# Patient Record
Sex: Female | Born: 1945 | Race: White | Hispanic: No | Marital: Married | Smoking: Current every day smoker
Health system: Southern US, Community
[De-identification: ages and names within clinical notes are randomized; demographics above are authoritative.]

## PROBLEM LIST (undated history)

## (undated) DIAGNOSIS — E079 Disorder of thyroid, unspecified: Secondary | ICD-10-CM

## (undated) DIAGNOSIS — E538 Deficiency of other specified B group vitamins: Secondary | ICD-10-CM

## (undated) DIAGNOSIS — F32A Depression, unspecified: Secondary | ICD-10-CM

## (undated) DIAGNOSIS — D649 Anemia, unspecified: Secondary | ICD-10-CM

## (undated) DIAGNOSIS — G459 Transient cerebral ischemic attack, unspecified: Secondary | ICD-10-CM

## (undated) DIAGNOSIS — J811 Chronic pulmonary edema: Secondary | ICD-10-CM

## (undated) DIAGNOSIS — J449 Chronic obstructive pulmonary disease, unspecified: Secondary | ICD-10-CM

## (undated) DIAGNOSIS — I10 Essential (primary) hypertension: Secondary | ICD-10-CM

## (undated) DIAGNOSIS — E871 Hypo-osmolality and hyponatremia: Secondary | ICD-10-CM

## (undated) DIAGNOSIS — I739 Peripheral vascular disease, unspecified: Secondary | ICD-10-CM

## (undated) HISTORY — DX: Hypo-osmolality and hyponatremia: E87.1

## (undated) HISTORY — DX: Depression, unspecified: F32.A

## (undated) HISTORY — DX: Disorder of thyroid, unspecified: E07.9

## (undated) HISTORY — DX: Essential (primary) hypertension: I10

## (undated) HISTORY — DX: Deficiency of other specified B group vitamins: E53.8

## (undated) HISTORY — DX: Transient cerebral ischemic attack, unspecified: G45.9

## (undated) HISTORY — DX: Chronic obstructive pulmonary disease, unspecified: J44.9

## (undated) HISTORY — PX: SHOULDER SURGERY: SHX246

## (undated) HISTORY — DX: Anemia, unspecified: D64.9

## (undated) HISTORY — PX: OTHER SURGICAL HISTORY: SHX169

## (undated) HISTORY — DX: Chronic pulmonary edema: J81.1

## (undated) HISTORY — DX: Peripheral vascular disease, unspecified: I73.9

---

## 2008-08-07 ENCOUNTER — Ambulatory Visit (HOSPITAL_COMMUNITY): Admission: RE | Admit: 2008-08-07 | Discharge: 2008-08-08 | Payer: Self-pay | Admitting: Neurosurgery

## 2010-01-20 IMAGING — CR DG CERVICAL SPINE 2 OR 3 VIEWS
1 series · 1 of 1 positions shown · non-contrast
Comparison: None

CLINICAL DATA: C5-6 fusion

CERVICAL SPINE - 2-3 VIEW

[view not recorded]
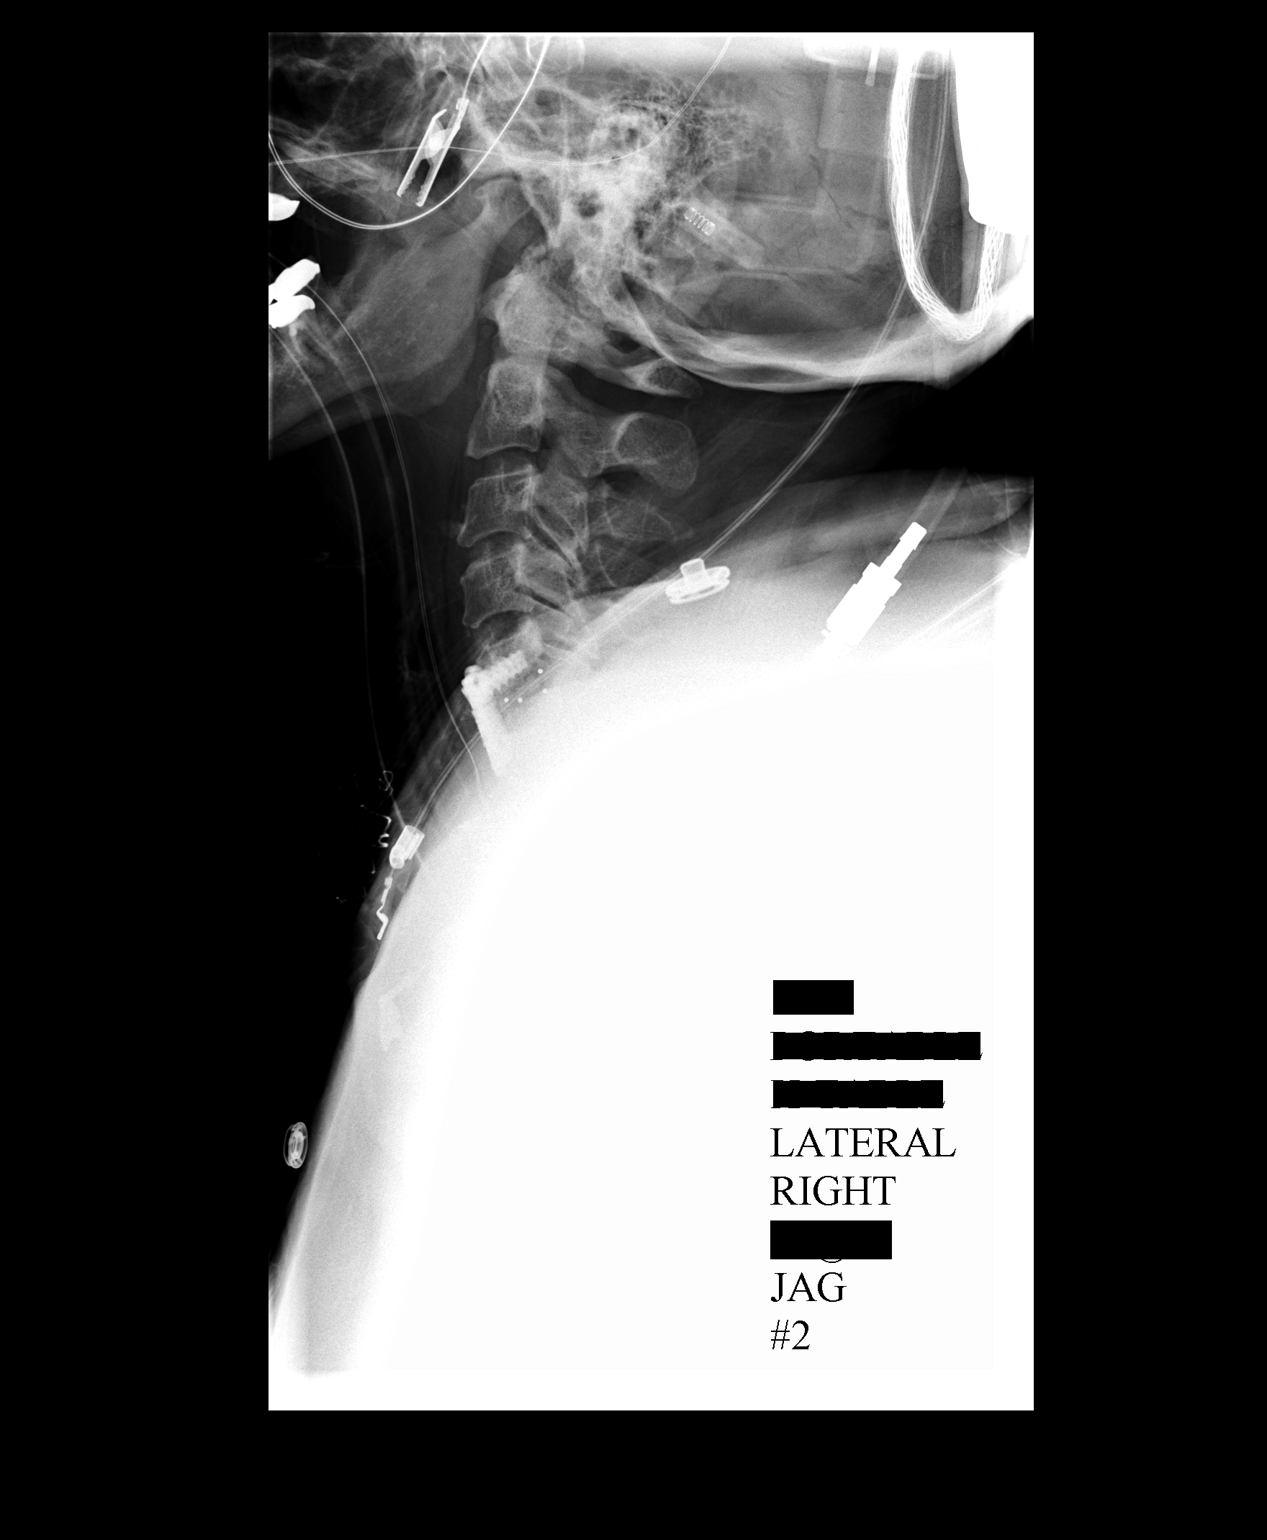

[1 of 1 positions shown; findings below may reference images not displayed]

FINDINGS: A surgical instrument projects over the C4-5 disc space
on the first image.

On the second image, plate and screws are seen transfixing C5-C6
with an interposed disc spacer.  C6 is obscured.  There is anatomic
alignment of the visualized vertebral bodies.  There is no breakage
or loosening of the hardware.
IMPRESSION: C5-6 anterior discectomy and fusion.

## 2011-03-25 NOTE — Op Note (Signed)
Ariel Baker, Ariel Baker            ACCOUNT NO.:  [id number]   MEDICAL RECORD NO.:  [id number]          PATIENT TYPE:  OIB   LOCATION:  3524                         FACILITY:  [facility]   PHYSICIAN:  Cristi Loron, M.D.DATE OF BIRTH:  1946/04/09   DATE OF PROCEDURE:  08/07/2008  DATE OF DISCHARGE:                               OPERATIVE REPORT   BRIEF HISTORY:  The patient is a 65 year old white female who has  suffered from neck and right arm pain consistent with right C6  radiculopathy.  She failed medical management and was worked up with a  cervical MRI which demonstrated the patient had spondylosis and stenosis  at C5-6.  I discussed the various treatment options with the patient  including surgery.  She has weighed the risks, benefits, and alternative  of the surgery, and decided to proceed with C5-6 anterior cervical  diskectomy, fusion, and plating.   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   PROCEDURES:  C5-6 extensive anterior cervical diskectomy/decompression;  C5-6 anterior interbody arthrodesis with local morselized autograft  bone, and active fused bone graft extender; insertion of C5-6 interbody  prosthesis (Alphatec PEEK interbody prosthesis); C5-6 anterior cervical  plating (Codman slim lock titanium plate and screws).   SURGEON:  Cristi Loron, MD   ASSISTANT:  Hilda Lias, MD   ANESTHESIA:  General endotracheal.   ESTIMATED BLOOD LOSS:  75 mL.   SPECIMENS:  None.   DRAINS:  None.   COMPLICATIONS:  None.   DESCRIPTION OF PROCEDURE:  The patient was brought to the operating room  by the anesthesia team.  General endotracheal anesthesia was induced.  The patient remained in supine position.  A roll was placed under her  shoulders to place  her neck in slight extension.  Her anterior cervical  region was then prepared with Betadine scrub and Betadine solution.  Sterile drapes were applied.  I then injected the area to be incised  with Marcaine with epinephrine solution.  I used a scalpel to make a  transverse incision in the patient's left anterior neck.  I used  Metzenbaum scissors to divide the platysma muscle and then to dissect  medial sternocleidomastoid muscle, jugular vein, and carotid artery.  We  carefully identified the esophagus retracting it medially.  We then used  a Kittner swabs to clear soft tissue from the anterior cervical spine  exposing intervertebral disk spaces.  I inserted a bent spinal needle  into the upper exposed intervertebral disk space and we obtained  intraoperative radiograph to confirm our location.   I then used electrocautery to detach the medial border of the longus  colli muscle bilaterally from C5-6 intervertebral disk space.  We then  inserted a Caspar self-retaining retractor underneath the longus colli  muscle bilaterally to provide exposure.   We began the decompression by incising the C5-6 intervertebral disk with  a #15 blade scalpel.  The disk space was quite narrow and spondylotic.  We performed a partial intervertebral diskectomy with the pituitary  forceps.  We then inserted the distraction screws at C5 and C6 and  distracted the interspace.  Then used high-speed drill to decorticate  the vertebral endplates at C5-6, drilled away the remainder of C5-6  intervertebral disk, to drill away some posterior spondylosis and to  thin out the posterior longitudinal ligament.  I then incised the  ligament with arachnoid knife and removed it with Kerrison punch  undercutting the vertebral endplates decompressing the thecal sac.  We  then performed foraminotomies about the bilateral C6 nerve root  completing the decompression at this level.   We now turned attention to the arthrodesis.   We used trial spacers and  determined to use 5-mm medium PEEK interbody prosthesis.  We prefilled  this prosthesis with a combination of local autograft bone we obtained  during the decompression and active fused bone graft extender.  We  inserted the prosthesis and distracted C5-6 interspace and then removed  distraction screws.  There was good snug fit of the prosthesis.   We now turned our attention to anterior spinal instrumentation.  We used  a high-speed drill to remove some ventral spondylosis from the vertebral  endplates at C5-6, so that the plate would lay down flat.  We selected  appropriate length Codman slim lock anterior cervical plate and laid it  along the anterior aspect of the vertebral bodies at C5-C6.  We then  drilled two 12-mm hole to C5-C6 and then secured the plate to the  vertebral bodies by placing two 12-mm self-tapping screws at C5-C6.  We  obtained intraoperative radiograph that demonstrated good position of  plate screws and interbody prosthesis with limited visualization because  of the patient's shoulders, but the construct looked good in vivo.  We  therefore secured the screws and plate by locking each cam.   We then obtained hemostasis using bipolar electrocautery.  We irrigated  the wound out with bacitracin solution.  We removed the retractor.  We  inspected the esophagus for any damage.  It was none apparent.  We then  reapproximated the patient's platysma muscle with interrupted 3-0 Vicryl  suture, subcutaneous tissue with interrupted 3-0 Vicryl suture, and the  skin with Steri-Strips and Benzoin.  The wound was then coated with  bacitracin ointment.  Sterile dressing was applied.  The drapes were  removed.  The patient was subsequently extubated by the anesthesia team  and transported to postanesthesia care unit in stable condition.  All  sponge, instrument, and needle counts were correct at the end of this  case.      Cristi Loron,  M.D.  Electronically Signed     JDJ/MEDQ  D:  08/07/2008  T:  08/08/2008  Job:  [id number]

## 2011-08-11 LAB — DIFFERENTIAL
Lymphocytes Relative: 29
Lymphs Abs: 2.5
Neutrophils Relative %: 58

## 2011-08-11 LAB — CBC
HCT: 38.6
Platelets: 291
WBC: 8.6

## 2011-08-11 LAB — BASIC METABOLIC PANEL
BUN: 4 — ABNORMAL LOW
GFR calc Af Amer: >60
GFR calc non Af Amer: >60
Potassium: 4.2

## 2015-03-09 DIAGNOSIS — Z0000 Encounter for general adult medical examination without abnormal findings: Secondary | ICD-10-CM | POA: Diagnosis not present

## 2015-03-09 DIAGNOSIS — H00013 Hordeolum externum right eye, unspecified eyelid: Secondary | ICD-10-CM | POA: Diagnosis not present

## 2015-03-09 DIAGNOSIS — Z720 Tobacco use: Secondary | ICD-10-CM | POA: Diagnosis not present

## 2015-03-23 DIAGNOSIS — B9689 Other specified bacterial agents as the cause of diseases classified elsewhere: Secondary | ICD-10-CM | POA: Diagnosis not present

## 2015-03-23 DIAGNOSIS — J019 Acute sinusitis, unspecified: Secondary | ICD-10-CM | POA: Diagnosis not present

## 2015-04-27 DIAGNOSIS — I10 Essential (primary) hypertension: Secondary | ICD-10-CM | POA: Diagnosis not present

## 2015-04-27 DIAGNOSIS — M19019 Primary osteoarthritis, unspecified shoulder: Secondary | ICD-10-CM | POA: Diagnosis not present

## 2015-04-27 DIAGNOSIS — K219 Gastro-esophageal reflux disease without esophagitis: Secondary | ICD-10-CM | POA: Diagnosis not present

## 2015-04-27 DIAGNOSIS — Z79899 Other long term (current) drug therapy: Secondary | ICD-10-CM | POA: Diagnosis not present

## 2015-04-27 DIAGNOSIS — E782 Mixed hyperlipidemia: Secondary | ICD-10-CM | POA: Diagnosis not present

## 2015-04-27 DIAGNOSIS — J449 Chronic obstructive pulmonary disease, unspecified: Secondary | ICD-10-CM | POA: Diagnosis not present

## 2015-10-03 DIAGNOSIS — J441 Chronic obstructive pulmonary disease with (acute) exacerbation: Secondary | ICD-10-CM | POA: Diagnosis not present

## 2015-10-03 DIAGNOSIS — H6120 Impacted cerumen, unspecified ear: Secondary | ICD-10-CM | POA: Diagnosis not present

## 2015-12-26 DIAGNOSIS — Z9181 History of falling: Secondary | ICD-10-CM | POA: Diagnosis not present

## 2015-12-26 DIAGNOSIS — H109 Unspecified conjunctivitis: Secondary | ICD-10-CM | POA: Diagnosis not present

## 2015-12-26 DIAGNOSIS — J441 Chronic obstructive pulmonary disease with (acute) exacerbation: Secondary | ICD-10-CM | POA: Diagnosis not present

## 2015-12-26 DIAGNOSIS — Z1389 Encounter for screening for other disorder: Secondary | ICD-10-CM | POA: Diagnosis not present

## 2016-01-10 DIAGNOSIS — J449 Chronic obstructive pulmonary disease, unspecified: Secondary | ICD-10-CM | POA: Diagnosis not present

## 2016-01-11 DIAGNOSIS — J449 Chronic obstructive pulmonary disease, unspecified: Secondary | ICD-10-CM | POA: Diagnosis not present

## 2016-05-29 DIAGNOSIS — E782 Mixed hyperlipidemia: Secondary | ICD-10-CM | POA: Diagnosis not present

## 2016-05-29 DIAGNOSIS — J449 Chronic obstructive pulmonary disease, unspecified: Secondary | ICD-10-CM | POA: Diagnosis not present

## 2016-05-29 DIAGNOSIS — I10 Essential (primary) hypertension: Secondary | ICD-10-CM | POA: Diagnosis not present

## 2016-05-29 DIAGNOSIS — Z0000 Encounter for general adult medical examination without abnormal findings: Secondary | ICD-10-CM | POA: Diagnosis not present

## 2016-05-29 DIAGNOSIS — Z23 Encounter for immunization: Secondary | ICD-10-CM | POA: Diagnosis not present

## 2016-05-29 DIAGNOSIS — K219 Gastro-esophageal reflux disease without esophagitis: Secondary | ICD-10-CM | POA: Diagnosis not present

## 2016-05-29 DIAGNOSIS — Z79899 Other long term (current) drug therapy: Secondary | ICD-10-CM | POA: Diagnosis not present

## 2017-06-01 DIAGNOSIS — Z79899 Other long term (current) drug therapy: Secondary | ICD-10-CM | POA: Diagnosis not present

## 2017-06-01 DIAGNOSIS — Z6823 Body mass index (BMI) 23.0-23.9, adult: Secondary | ICD-10-CM | POA: Diagnosis not present

## 2017-06-01 DIAGNOSIS — I10 Essential (primary) hypertension: Secondary | ICD-10-CM | POA: Diagnosis not present

## 2017-06-01 DIAGNOSIS — K219 Gastro-esophageal reflux disease without esophagitis: Secondary | ICD-10-CM | POA: Diagnosis not present

## 2017-06-01 DIAGNOSIS — H6120 Impacted cerumen, unspecified ear: Secondary | ICD-10-CM | POA: Diagnosis not present

## 2017-06-01 DIAGNOSIS — Z0000 Encounter for general adult medical examination without abnormal findings: Secondary | ICD-10-CM | POA: Diagnosis not present

## 2017-07-08 DIAGNOSIS — Z1211 Encounter for screening for malignant neoplasm of colon: Secondary | ICD-10-CM | POA: Diagnosis not present

## 2017-08-07 DIAGNOSIS — S40012A Contusion of left shoulder, initial encounter: Secondary | ICD-10-CM | POA: Diagnosis not present

## 2018-08-11 DIAGNOSIS — E782 Mixed hyperlipidemia: Secondary | ICD-10-CM | POA: Diagnosis not present

## 2018-08-11 DIAGNOSIS — Z23 Encounter for immunization: Secondary | ICD-10-CM | POA: Diagnosis not present

## 2018-08-11 DIAGNOSIS — Z6822 Body mass index (BMI) 22.0-22.9, adult: Secondary | ICD-10-CM | POA: Diagnosis not present

## 2018-08-11 DIAGNOSIS — Z0000 Encounter for general adult medical examination without abnormal findings: Secondary | ICD-10-CM | POA: Diagnosis not present

## 2018-08-11 DIAGNOSIS — Z79899 Other long term (current) drug therapy: Secondary | ICD-10-CM | POA: Diagnosis not present

## 2019-07-11 DIAGNOSIS — E782 Mixed hyperlipidemia: Secondary | ICD-10-CM | POA: Diagnosis not present

## 2019-07-11 DIAGNOSIS — I10 Essential (primary) hypertension: Secondary | ICD-10-CM | POA: Diagnosis not present

## 2019-08-23 DIAGNOSIS — Z0000 Encounter for general adult medical examination without abnormal findings: Secondary | ICD-10-CM | POA: Diagnosis not present

## 2019-08-23 DIAGNOSIS — E782 Mixed hyperlipidemia: Secondary | ICD-10-CM | POA: Diagnosis not present

## 2019-08-23 DIAGNOSIS — Z23 Encounter for immunization: Secondary | ICD-10-CM | POA: Diagnosis not present

## 2019-08-23 DIAGNOSIS — Z6823 Body mass index (BMI) 23.0-23.9, adult: Secondary | ICD-10-CM | POA: Diagnosis not present

## 2019-08-23 DIAGNOSIS — Z79899 Other long term (current) drug therapy: Secondary | ICD-10-CM | POA: Diagnosis not present

## 2020-10-15 DIAGNOSIS — Z23 Encounter for immunization: Secondary | ICD-10-CM | POA: Diagnosis not present

## 2020-10-15 DIAGNOSIS — E782 Mixed hyperlipidemia: Secondary | ICD-10-CM | POA: Diagnosis not present

## 2020-10-15 DIAGNOSIS — Z0000 Encounter for general adult medical examination without abnormal findings: Secondary | ICD-10-CM | POA: Diagnosis not present

## 2020-10-15 DIAGNOSIS — I10 Essential (primary) hypertension: Secondary | ICD-10-CM | POA: Diagnosis not present

## 2020-10-15 DIAGNOSIS — Z6822 Body mass index (BMI) 22.0-22.9, adult: Secondary | ICD-10-CM | POA: Diagnosis not present

## 2020-10-15 DIAGNOSIS — Z79899 Other long term (current) drug therapy: Secondary | ICD-10-CM | POA: Diagnosis not present

## 2021-10-23 DIAGNOSIS — K219 Gastro-esophageal reflux disease without esophagitis: Secondary | ICD-10-CM | POA: Diagnosis not present

## 2021-10-23 DIAGNOSIS — I10 Essential (primary) hypertension: Secondary | ICD-10-CM | POA: Diagnosis not present

## 2021-10-23 DIAGNOSIS — Z23 Encounter for immunization: Secondary | ICD-10-CM | POA: Diagnosis not present

## 2021-10-23 DIAGNOSIS — J449 Chronic obstructive pulmonary disease, unspecified: Secondary | ICD-10-CM | POA: Diagnosis not present

## 2021-10-23 DIAGNOSIS — Z0000 Encounter for general adult medical examination without abnormal findings: Secondary | ICD-10-CM | POA: Diagnosis not present

## 2021-10-23 DIAGNOSIS — R2689 Other abnormalities of gait and mobility: Secondary | ICD-10-CM | POA: Diagnosis not present

## 2021-10-23 DIAGNOSIS — E782 Mixed hyperlipidemia: Secondary | ICD-10-CM | POA: Diagnosis not present

## 2021-10-23 DIAGNOSIS — Z6821 Body mass index (BMI) 21.0-21.9, adult: Secondary | ICD-10-CM | POA: Diagnosis not present

## 2022-10-27 DIAGNOSIS — J441 Chronic obstructive pulmonary disease with (acute) exacerbation: Secondary | ICD-10-CM | POA: Diagnosis not present

## 2022-10-27 DIAGNOSIS — Z6820 Body mass index (BMI) 20.0-20.9, adult: Secondary | ICD-10-CM | POA: Diagnosis not present

## 2022-10-27 DIAGNOSIS — E782 Mixed hyperlipidemia: Secondary | ICD-10-CM | POA: Diagnosis not present

## 2022-10-27 DIAGNOSIS — Z79899 Other long term (current) drug therapy: Secondary | ICD-10-CM | POA: Diagnosis not present

## 2022-10-27 DIAGNOSIS — Z23 Encounter for immunization: Secondary | ICD-10-CM | POA: Diagnosis not present

## 2022-10-27 DIAGNOSIS — Z0000 Encounter for general adult medical examination without abnormal findings: Secondary | ICD-10-CM | POA: Diagnosis not present

## 2022-10-27 DIAGNOSIS — Z760 Encounter for issue of repeat prescription: Secondary | ICD-10-CM | POA: Diagnosis not present

## 2023-02-10 DIAGNOSIS — Z7409 Other reduced mobility: Secondary | ICD-10-CM | POA: Diagnosis not present

## 2023-02-10 DIAGNOSIS — Z79899 Other long term (current) drug therapy: Secondary | ICD-10-CM | POA: Diagnosis not present

## 2023-02-10 DIAGNOSIS — Z7401 Bed confinement status: Secondary | ICD-10-CM | POA: Diagnosis not present

## 2023-02-10 DIAGNOSIS — Z743 Need for continuous supervision: Secondary | ICD-10-CM | POA: Diagnosis not present

## 2023-02-10 DIAGNOSIS — M978XXA Periprosthetic fracture around other internal prosthetic joint, initial encounter: Secondary | ICD-10-CM | POA: Diagnosis not present

## 2023-02-10 DIAGNOSIS — W19XXXD Unspecified fall, subsequent encounter: Secondary | ICD-10-CM | POA: Diagnosis not present

## 2023-02-10 DIAGNOSIS — Z96642 Presence of left artificial hip joint: Secondary | ICD-10-CM | POA: Diagnosis not present

## 2023-02-10 DIAGNOSIS — R410 Disorientation, unspecified: Secondary | ICD-10-CM | POA: Diagnosis not present

## 2023-02-10 DIAGNOSIS — Z471 Aftercare following joint replacement surgery: Secondary | ICD-10-CM | POA: Diagnosis not present

## 2023-02-10 DIAGNOSIS — M9702XA Periprosthetic fracture around internal prosthetic left hip joint, initial encounter: Secondary | ICD-10-CM | POA: Diagnosis not present

## 2023-02-10 DIAGNOSIS — R262 Difficulty in walking, not elsewhere classified: Secondary | ICD-10-CM | POA: Diagnosis not present

## 2023-02-10 DIAGNOSIS — Z7982 Long term (current) use of aspirin: Secondary | ICD-10-CM | POA: Diagnosis not present

## 2023-02-10 DIAGNOSIS — Z96643 Presence of artificial hip joint, bilateral: Secondary | ICD-10-CM | POA: Diagnosis not present

## 2023-02-10 DIAGNOSIS — D649 Anemia, unspecified: Secondary | ICD-10-CM | POA: Diagnosis not present

## 2023-02-10 DIAGNOSIS — A419 Sepsis, unspecified organism: Secondary | ICD-10-CM | POA: Diagnosis not present

## 2023-02-10 DIAGNOSIS — W19XXXA Unspecified fall, initial encounter: Secondary | ICD-10-CM | POA: Diagnosis not present

## 2023-02-10 DIAGNOSIS — R0902 Hypoxemia: Secondary | ICD-10-CM | POA: Diagnosis not present

## 2023-02-10 DIAGNOSIS — K219 Gastro-esophageal reflux disease without esophagitis: Secondary | ICD-10-CM | POA: Diagnosis not present

## 2023-02-10 DIAGNOSIS — M1990 Unspecified osteoarthritis, unspecified site: Secondary | ICD-10-CM | POA: Diagnosis not present

## 2023-02-10 DIAGNOSIS — Z741 Need for assistance with personal care: Secondary | ICD-10-CM | POA: Diagnosis not present

## 2023-02-10 DIAGNOSIS — E871 Hypo-osmolality and hyponatremia: Secondary | ICD-10-CM | POA: Diagnosis not present

## 2023-02-10 DIAGNOSIS — S72112A Displaced fracture of greater trochanter of left femur, initial encounter for closed fracture: Secondary | ICD-10-CM | POA: Diagnosis not present

## 2023-02-10 DIAGNOSIS — F32A Depression, unspecified: Secondary | ICD-10-CM | POA: Diagnosis not present

## 2023-02-10 DIAGNOSIS — I10 Essential (primary) hypertension: Secondary | ICD-10-CM | POA: Diagnosis not present

## 2023-02-10 DIAGNOSIS — Z885 Allergy status to narcotic agent status: Secondary | ICD-10-CM | POA: Diagnosis not present

## 2023-02-10 DIAGNOSIS — J449 Chronic obstructive pulmonary disease, unspecified: Secondary | ICD-10-CM | POA: Diagnosis not present

## 2023-02-10 DIAGNOSIS — S0990XA Unspecified injury of head, initial encounter: Secondary | ICD-10-CM | POA: Diagnosis not present

## 2023-02-10 DIAGNOSIS — R279 Unspecified lack of coordination: Secondary | ICD-10-CM | POA: Diagnosis not present

## 2023-02-10 DIAGNOSIS — R5381 Other malaise: Secondary | ICD-10-CM | POA: Diagnosis not present

## 2023-02-10 DIAGNOSIS — M6281 Muscle weakness (generalized): Secondary | ICD-10-CM | POA: Diagnosis not present

## 2023-02-10 DIAGNOSIS — M9702XD Periprosthetic fracture around internal prosthetic left hip joint, subsequent encounter: Secondary | ICD-10-CM | POA: Diagnosis not present

## 2023-02-10 DIAGNOSIS — R488 Other symbolic dysfunctions: Secondary | ICD-10-CM | POA: Diagnosis not present

## 2023-02-10 DIAGNOSIS — F17210 Nicotine dependence, cigarettes, uncomplicated: Secondary | ICD-10-CM | POA: Diagnosis not present

## 2023-02-10 DIAGNOSIS — Z8673 Personal history of transient ischemic attack (TIA), and cerebral infarction without residual deficits: Secondary | ICD-10-CM | POA: Diagnosis not present

## 2023-02-10 DIAGNOSIS — M25552 Pain in left hip: Secondary | ICD-10-CM | POA: Diagnosis not present

## 2023-02-10 DIAGNOSIS — R2681 Unsteadiness on feet: Secondary | ICD-10-CM | POA: Diagnosis not present

## 2023-02-13 DIAGNOSIS — I10 Essential (primary) hypertension: Secondary | ICD-10-CM | POA: Diagnosis not present

## 2023-02-13 DIAGNOSIS — M79631 Pain in right forearm: Secondary | ICD-10-CM | POA: Diagnosis not present

## 2023-02-13 DIAGNOSIS — J810 Acute pulmonary edema: Secondary | ICD-10-CM | POA: Diagnosis not present

## 2023-02-13 DIAGNOSIS — R279 Unspecified lack of coordination: Secondary | ICD-10-CM | POA: Diagnosis not present

## 2023-02-13 DIAGNOSIS — Z79899 Other long term (current) drug therapy: Secondary | ICD-10-CM | POA: Diagnosis not present

## 2023-02-13 DIAGNOSIS — G9341 Metabolic encephalopathy: Secondary | ICD-10-CM | POA: Diagnosis not present

## 2023-02-13 DIAGNOSIS — Z743 Need for continuous supervision: Secondary | ICD-10-CM | POA: Diagnosis not present

## 2023-02-13 DIAGNOSIS — I739 Peripheral vascular disease, unspecified: Secondary | ICD-10-CM | POA: Diagnosis not present

## 2023-02-13 DIAGNOSIS — M978XXA Periprosthetic fracture around other internal prosthetic joint, initial encounter: Secondary | ICD-10-CM | POA: Diagnosis not present

## 2023-02-13 DIAGNOSIS — Z7409 Other reduced mobility: Secondary | ICD-10-CM | POA: Diagnosis not present

## 2023-02-13 DIAGNOSIS — R5381 Other malaise: Secondary | ICD-10-CM | POA: Diagnosis not present

## 2023-02-13 DIAGNOSIS — Z8673 Personal history of transient ischemic attack (TIA), and cerebral infarction without residual deficits: Secondary | ICD-10-CM | POA: Diagnosis not present

## 2023-02-13 DIAGNOSIS — M9702XA Periprosthetic fracture around internal prosthetic left hip joint, initial encounter: Secondary | ICD-10-CM | POA: Diagnosis not present

## 2023-02-13 DIAGNOSIS — Z043 Encounter for examination and observation following other accident: Secondary | ICD-10-CM | POA: Diagnosis not present

## 2023-02-13 DIAGNOSIS — R488 Other symbolic dysfunctions: Secondary | ICD-10-CM | POA: Diagnosis not present

## 2023-02-13 DIAGNOSIS — R519 Headache, unspecified: Secondary | ICD-10-CM | POA: Diagnosis not present

## 2023-02-13 DIAGNOSIS — E861 Hypovolemia: Secondary | ICD-10-CM | POA: Diagnosis not present

## 2023-02-13 DIAGNOSIS — R2681 Unsteadiness on feet: Secondary | ICD-10-CM | POA: Diagnosis not present

## 2023-02-13 DIAGNOSIS — Z7401 Bed confinement status: Secondary | ICD-10-CM | POA: Diagnosis not present

## 2023-02-13 DIAGNOSIS — E119 Type 2 diabetes mellitus without complications: Secondary | ICD-10-CM | POA: Diagnosis not present

## 2023-02-13 DIAGNOSIS — R531 Weakness: Secondary | ICD-10-CM | POA: Diagnosis not present

## 2023-02-13 DIAGNOSIS — R0902 Hypoxemia: Secondary | ICD-10-CM | POA: Diagnosis not present

## 2023-02-13 DIAGNOSIS — W19XXXA Unspecified fall, initial encounter: Secondary | ICD-10-CM | POA: Diagnosis not present

## 2023-02-13 DIAGNOSIS — D519 Vitamin B12 deficiency anemia, unspecified: Secondary | ICD-10-CM | POA: Diagnosis not present

## 2023-02-13 DIAGNOSIS — Z7189 Other specified counseling: Secondary | ICD-10-CM | POA: Diagnosis not present

## 2023-02-13 DIAGNOSIS — Z471 Aftercare following joint replacement surgery: Secondary | ICD-10-CM | POA: Diagnosis not present

## 2023-02-13 DIAGNOSIS — Z7982 Long term (current) use of aspirin: Secondary | ICD-10-CM | POA: Diagnosis not present

## 2023-02-13 DIAGNOSIS — F32A Depression, unspecified: Secondary | ICD-10-CM | POA: Diagnosis not present

## 2023-02-13 DIAGNOSIS — W19XXXD Unspecified fall, subsequent encounter: Secondary | ICD-10-CM | POA: Diagnosis not present

## 2023-02-13 DIAGNOSIS — J449 Chronic obstructive pulmonary disease, unspecified: Secondary | ICD-10-CM | POA: Diagnosis not present

## 2023-02-13 DIAGNOSIS — R410 Disorientation, unspecified: Secondary | ICD-10-CM | POA: Diagnosis not present

## 2023-02-13 DIAGNOSIS — D509 Iron deficiency anemia, unspecified: Secondary | ICD-10-CM | POA: Diagnosis not present

## 2023-02-13 DIAGNOSIS — E871 Hypo-osmolality and hyponatremia: Secondary | ICD-10-CM | POA: Diagnosis not present

## 2023-02-13 DIAGNOSIS — M1990 Unspecified osteoarthritis, unspecified site: Secondary | ICD-10-CM | POA: Diagnosis not present

## 2023-02-13 DIAGNOSIS — K219 Gastro-esophageal reflux disease without esophagitis: Secondary | ICD-10-CM | POA: Diagnosis not present

## 2023-02-13 DIAGNOSIS — R069 Unspecified abnormalities of breathing: Secondary | ICD-10-CM | POA: Diagnosis not present

## 2023-02-13 DIAGNOSIS — Z741 Need for assistance with personal care: Secondary | ICD-10-CM | POA: Diagnosis not present

## 2023-02-13 DIAGNOSIS — A419 Sepsis, unspecified organism: Secondary | ICD-10-CM | POA: Diagnosis not present

## 2023-02-13 DIAGNOSIS — Z8711 Personal history of peptic ulcer disease: Secondary | ICD-10-CM | POA: Diagnosis not present

## 2023-02-13 DIAGNOSIS — M9702XD Periprosthetic fracture around internal prosthetic left hip joint, subsequent encounter: Secondary | ICD-10-CM | POA: Diagnosis not present

## 2023-02-13 DIAGNOSIS — Z96649 Presence of unspecified artificial hip joint: Secondary | ICD-10-CM | POA: Diagnosis not present

## 2023-02-13 DIAGNOSIS — S72112A Displaced fracture of greater trochanter of left femur, initial encounter for closed fracture: Secondary | ICD-10-CM | POA: Diagnosis not present

## 2023-02-13 DIAGNOSIS — Z885 Allergy status to narcotic agent status: Secondary | ICD-10-CM | POA: Diagnosis not present

## 2023-02-13 DIAGNOSIS — M6281 Muscle weakness (generalized): Secondary | ICD-10-CM | POA: Diagnosis not present

## 2023-02-13 DIAGNOSIS — D649 Anemia, unspecified: Secondary | ICD-10-CM | POA: Diagnosis not present

## 2023-02-16 DIAGNOSIS — J449 Chronic obstructive pulmonary disease, unspecified: Secondary | ICD-10-CM | POA: Diagnosis not present

## 2023-02-17 DIAGNOSIS — J449 Chronic obstructive pulmonary disease, unspecified: Secondary | ICD-10-CM | POA: Diagnosis not present

## 2023-02-19 DIAGNOSIS — E119 Type 2 diabetes mellitus without complications: Secondary | ICD-10-CM | POA: Diagnosis not present

## 2023-02-19 DIAGNOSIS — Z7189 Other specified counseling: Secondary | ICD-10-CM | POA: Diagnosis not present

## 2023-03-05 DIAGNOSIS — M978XXA Periprosthetic fracture around other internal prosthetic joint, initial encounter: Secondary | ICD-10-CM | POA: Diagnosis not present

## 2023-03-05 DIAGNOSIS — Z96649 Presence of unspecified artificial hip joint: Secondary | ICD-10-CM | POA: Diagnosis not present

## 2023-03-12 DIAGNOSIS — I10 Essential (primary) hypertension: Secondary | ICD-10-CM | POA: Diagnosis not present

## 2023-03-12 DIAGNOSIS — Z043 Encounter for examination and observation following other accident: Secondary | ICD-10-CM | POA: Diagnosis not present

## 2023-03-12 DIAGNOSIS — Z885 Allergy status to narcotic agent status: Secondary | ICD-10-CM | POA: Diagnosis not present

## 2023-03-12 DIAGNOSIS — R0902 Hypoxemia: Secondary | ICD-10-CM | POA: Diagnosis not present

## 2023-03-12 DIAGNOSIS — Z79899 Other long term (current) drug therapy: Secondary | ICD-10-CM | POA: Diagnosis not present

## 2023-03-12 DIAGNOSIS — Z741 Need for assistance with personal care: Secondary | ICD-10-CM | POA: Diagnosis not present

## 2023-03-12 DIAGNOSIS — J449 Chronic obstructive pulmonary disease, unspecified: Secondary | ICD-10-CM | POA: Diagnosis not present

## 2023-03-12 DIAGNOSIS — R488 Other symbolic dysfunctions: Secondary | ICD-10-CM | POA: Diagnosis not present

## 2023-03-12 DIAGNOSIS — Z7982 Long term (current) use of aspirin: Secondary | ICD-10-CM | POA: Diagnosis not present

## 2023-03-12 DIAGNOSIS — M79631 Pain in right forearm: Secondary | ICD-10-CM | POA: Diagnosis not present

## 2023-03-12 DIAGNOSIS — Z8673 Personal history of transient ischemic attack (TIA), and cerebral infarction without residual deficits: Secondary | ICD-10-CM | POA: Diagnosis not present

## 2023-03-12 DIAGNOSIS — W19XXXD Unspecified fall, subsequent encounter: Secondary | ICD-10-CM | POA: Diagnosis not present

## 2023-03-12 DIAGNOSIS — Z7401 Bed confinement status: Secondary | ICD-10-CM | POA: Diagnosis not present

## 2023-03-12 DIAGNOSIS — Z471 Aftercare following joint replacement surgery: Secondary | ICD-10-CM | POA: Diagnosis not present

## 2023-03-12 DIAGNOSIS — R069 Unspecified abnormalities of breathing: Secondary | ICD-10-CM | POA: Diagnosis not present

## 2023-03-12 DIAGNOSIS — R1319 Other dysphagia: Secondary | ICD-10-CM | POA: Diagnosis not present

## 2023-03-12 DIAGNOSIS — M6281 Muscle weakness (generalized): Secondary | ICD-10-CM | POA: Diagnosis not present

## 2023-03-12 DIAGNOSIS — Z7409 Other reduced mobility: Secondary | ICD-10-CM | POA: Diagnosis not present

## 2023-03-12 DIAGNOSIS — M1990 Unspecified osteoarthritis, unspecified site: Secondary | ICD-10-CM | POA: Diagnosis not present

## 2023-03-12 DIAGNOSIS — G9341 Metabolic encephalopathy: Secondary | ICD-10-CM | POA: Diagnosis not present

## 2023-03-12 DIAGNOSIS — D519 Vitamin B12 deficiency anemia, unspecified: Secondary | ICD-10-CM | POA: Diagnosis not present

## 2023-03-12 DIAGNOSIS — J810 Acute pulmonary edema: Secondary | ICD-10-CM | POA: Diagnosis not present

## 2023-03-12 DIAGNOSIS — R410 Disorientation, unspecified: Secondary | ICD-10-CM | POA: Diagnosis not present

## 2023-03-12 DIAGNOSIS — Z8711 Personal history of peptic ulcer disease: Secondary | ICD-10-CM | POA: Diagnosis not present

## 2023-03-12 DIAGNOSIS — F32A Depression, unspecified: Secondary | ICD-10-CM | POA: Diagnosis not present

## 2023-03-12 DIAGNOSIS — R0600 Dyspnea, unspecified: Secondary | ICD-10-CM | POA: Diagnosis not present

## 2023-03-12 DIAGNOSIS — D649 Anemia, unspecified: Secondary | ICD-10-CM | POA: Diagnosis not present

## 2023-03-12 DIAGNOSIS — E861 Hypovolemia: Secondary | ICD-10-CM | POA: Diagnosis not present

## 2023-03-12 DIAGNOSIS — E871 Hypo-osmolality and hyponatremia: Secondary | ICD-10-CM | POA: Diagnosis not present

## 2023-03-12 DIAGNOSIS — D509 Iron deficiency anemia, unspecified: Secondary | ICD-10-CM | POA: Diagnosis not present

## 2023-03-12 DIAGNOSIS — W19XXXA Unspecified fall, initial encounter: Secondary | ICD-10-CM | POA: Diagnosis not present

## 2023-03-12 DIAGNOSIS — J811 Chronic pulmonary edema: Secondary | ICD-10-CM | POA: Diagnosis not present

## 2023-03-12 DIAGNOSIS — Z743 Need for continuous supervision: Secondary | ICD-10-CM | POA: Diagnosis not present

## 2023-03-12 DIAGNOSIS — R2681 Unsteadiness on feet: Secondary | ICD-10-CM | POA: Diagnosis not present

## 2023-03-12 DIAGNOSIS — R519 Headache, unspecified: Secondary | ICD-10-CM | POA: Diagnosis not present

## 2023-03-12 DIAGNOSIS — I739 Peripheral vascular disease, unspecified: Secondary | ICD-10-CM | POA: Diagnosis not present

## 2023-03-12 DIAGNOSIS — K219 Gastro-esophageal reflux disease without esophagitis: Secondary | ICD-10-CM | POA: Diagnosis not present

## 2023-03-12 DIAGNOSIS — M9702XD Periprosthetic fracture around internal prosthetic left hip joint, subsequent encounter: Secondary | ICD-10-CM | POA: Diagnosis not present

## 2023-03-12 DIAGNOSIS — R531 Weakness: Secondary | ICD-10-CM | POA: Diagnosis not present

## 2023-03-17 DIAGNOSIS — J810 Acute pulmonary edema: Secondary | ICD-10-CM | POA: Diagnosis not present

## 2023-03-17 DIAGNOSIS — W19XXXD Unspecified fall, subsequent encounter: Secondary | ICD-10-CM | POA: Diagnosis not present

## 2023-03-17 DIAGNOSIS — Z743 Need for continuous supervision: Secondary | ICD-10-CM | POA: Diagnosis not present

## 2023-03-17 DIAGNOSIS — N390 Urinary tract infection, site not specified: Secondary | ICD-10-CM | POA: Diagnosis not present

## 2023-03-17 DIAGNOSIS — Z8673 Personal history of transient ischemic attack (TIA), and cerebral infarction without residual deficits: Secondary | ICD-10-CM | POA: Diagnosis not present

## 2023-03-17 DIAGNOSIS — Z741 Need for assistance with personal care: Secondary | ICD-10-CM | POA: Diagnosis not present

## 2023-03-17 DIAGNOSIS — Z471 Aftercare following joint replacement surgery: Secondary | ICD-10-CM | POA: Diagnosis not present

## 2023-03-17 DIAGNOSIS — B871 Wound myiasis: Secondary | ICD-10-CM | POA: Diagnosis not present

## 2023-03-17 DIAGNOSIS — F32A Depression, unspecified: Secondary | ICD-10-CM | POA: Diagnosis not present

## 2023-03-17 DIAGNOSIS — I10 Essential (primary) hypertension: Secondary | ICD-10-CM | POA: Diagnosis not present

## 2023-03-17 DIAGNOSIS — E871 Hypo-osmolality and hyponatremia: Secondary | ICD-10-CM | POA: Diagnosis not present

## 2023-03-17 DIAGNOSIS — I739 Peripheral vascular disease, unspecified: Secondary | ICD-10-CM | POA: Diagnosis not present

## 2023-03-17 DIAGNOSIS — Z79899 Other long term (current) drug therapy: Secondary | ICD-10-CM | POA: Diagnosis not present

## 2023-03-17 DIAGNOSIS — R531 Weakness: Secondary | ICD-10-CM | POA: Diagnosis not present

## 2023-03-17 DIAGNOSIS — D649 Anemia, unspecified: Secondary | ICD-10-CM | POA: Diagnosis not present

## 2023-03-17 DIAGNOSIS — D519 Vitamin B12 deficiency anemia, unspecified: Secondary | ICD-10-CM | POA: Diagnosis not present

## 2023-03-17 DIAGNOSIS — R488 Other symbolic dysfunctions: Secondary | ICD-10-CM | POA: Diagnosis not present

## 2023-03-17 DIAGNOSIS — J449 Chronic obstructive pulmonary disease, unspecified: Secondary | ICD-10-CM | POA: Diagnosis not present

## 2023-03-17 DIAGNOSIS — R2681 Unsteadiness on feet: Secondary | ICD-10-CM | POA: Diagnosis not present

## 2023-03-17 DIAGNOSIS — R1319 Other dysphagia: Secondary | ICD-10-CM | POA: Diagnosis not present

## 2023-03-17 DIAGNOSIS — R0600 Dyspnea, unspecified: Secondary | ICD-10-CM | POA: Diagnosis not present

## 2023-03-17 DIAGNOSIS — M6281 Muscle weakness (generalized): Secondary | ICD-10-CM | POA: Diagnosis not present

## 2023-03-17 DIAGNOSIS — M9702XD Periprosthetic fracture around internal prosthetic left hip joint, subsequent encounter: Secondary | ICD-10-CM | POA: Diagnosis not present

## 2023-03-17 DIAGNOSIS — G9341 Metabolic encephalopathy: Secondary | ICD-10-CM | POA: Diagnosis not present

## 2023-03-17 DIAGNOSIS — D509 Iron deficiency anemia, unspecified: Secondary | ICD-10-CM | POA: Diagnosis not present

## 2023-03-17 DIAGNOSIS — Z7401 Bed confinement status: Secondary | ICD-10-CM | POA: Diagnosis not present

## 2023-03-17 DIAGNOSIS — Z7409 Other reduced mobility: Secondary | ICD-10-CM | POA: Diagnosis not present

## 2023-03-17 DIAGNOSIS — J811 Chronic pulmonary edema: Secondary | ICD-10-CM | POA: Diagnosis not present

## 2023-03-18 DIAGNOSIS — J449 Chronic obstructive pulmonary disease, unspecified: Secondary | ICD-10-CM | POA: Diagnosis not present

## 2023-03-19 DIAGNOSIS — Z7409 Other reduced mobility: Secondary | ICD-10-CM | POA: Diagnosis not present

## 2023-03-20 DIAGNOSIS — Z79899 Other long term (current) drug therapy: Secondary | ICD-10-CM | POA: Diagnosis not present

## 2023-03-23 DIAGNOSIS — D649 Anemia, unspecified: Secondary | ICD-10-CM | POA: Diagnosis not present

## 2023-03-24 DIAGNOSIS — D649 Anemia, unspecified: Secondary | ICD-10-CM | POA: Diagnosis not present

## 2023-03-25 DIAGNOSIS — D649 Anemia, unspecified: Secondary | ICD-10-CM | POA: Diagnosis not present

## 2023-03-27 DIAGNOSIS — I10 Essential (primary) hypertension: Secondary | ICD-10-CM | POA: Diagnosis not present

## 2023-03-31 DIAGNOSIS — D649 Anemia, unspecified: Secondary | ICD-10-CM | POA: Diagnosis not present

## 2023-04-01 DIAGNOSIS — B871 Wound myiasis: Secondary | ICD-10-CM | POA: Diagnosis not present

## 2023-04-02 DIAGNOSIS — N390 Urinary tract infection, site not specified: Secondary | ICD-10-CM | POA: Diagnosis not present

## 2023-04-03 DIAGNOSIS — Z7409 Other reduced mobility: Secondary | ICD-10-CM | POA: Diagnosis not present

## 2023-04-03 DIAGNOSIS — Z741 Need for assistance with personal care: Secondary | ICD-10-CM | POA: Diagnosis not present

## 2023-04-03 DIAGNOSIS — M6281 Muscle weakness (generalized): Secondary | ICD-10-CM | POA: Diagnosis not present

## 2023-04-03 DIAGNOSIS — R2681 Unsteadiness on feet: Secondary | ICD-10-CM | POA: Diagnosis not present

## 2023-04-06 DIAGNOSIS — M6281 Muscle weakness (generalized): Secondary | ICD-10-CM | POA: Diagnosis not present

## 2023-04-06 DIAGNOSIS — Z7409 Other reduced mobility: Secondary | ICD-10-CM | POA: Diagnosis not present

## 2023-04-06 DIAGNOSIS — Z741 Need for assistance with personal care: Secondary | ICD-10-CM | POA: Diagnosis not present

## 2023-04-06 DIAGNOSIS — R2681 Unsteadiness on feet: Secondary | ICD-10-CM | POA: Diagnosis not present

## 2023-04-07 DIAGNOSIS — R2681 Unsteadiness on feet: Secondary | ICD-10-CM | POA: Diagnosis not present

## 2023-04-07 DIAGNOSIS — M6281 Muscle weakness (generalized): Secondary | ICD-10-CM | POA: Diagnosis not present

## 2023-04-07 DIAGNOSIS — Z7409 Other reduced mobility: Secondary | ICD-10-CM | POA: Diagnosis not present

## 2023-04-07 DIAGNOSIS — Z741 Need for assistance with personal care: Secondary | ICD-10-CM | POA: Diagnosis not present

## 2023-04-08 DIAGNOSIS — R2681 Unsteadiness on feet: Secondary | ICD-10-CM | POA: Diagnosis not present

## 2023-04-08 DIAGNOSIS — Z741 Need for assistance with personal care: Secondary | ICD-10-CM | POA: Diagnosis not present

## 2023-04-08 DIAGNOSIS — Z7409 Other reduced mobility: Secondary | ICD-10-CM | POA: Diagnosis not present

## 2023-04-08 DIAGNOSIS — M6281 Muscle weakness (generalized): Secondary | ICD-10-CM | POA: Diagnosis not present

## 2023-04-09 DIAGNOSIS — Z741 Need for assistance with personal care: Secondary | ICD-10-CM | POA: Diagnosis not present

## 2023-04-09 DIAGNOSIS — Z7409 Other reduced mobility: Secondary | ICD-10-CM | POA: Diagnosis not present

## 2023-04-09 DIAGNOSIS — M6281 Muscle weakness (generalized): Secondary | ICD-10-CM | POA: Diagnosis not present

## 2023-04-09 DIAGNOSIS — R2681 Unsteadiness on feet: Secondary | ICD-10-CM | POA: Diagnosis not present

## 2023-04-10 DIAGNOSIS — Z741 Need for assistance with personal care: Secondary | ICD-10-CM | POA: Diagnosis not present

## 2023-04-10 DIAGNOSIS — M6281 Muscle weakness (generalized): Secondary | ICD-10-CM | POA: Diagnosis not present

## 2023-04-10 DIAGNOSIS — R2681 Unsteadiness on feet: Secondary | ICD-10-CM | POA: Diagnosis not present

## 2023-04-10 DIAGNOSIS — Z7409 Other reduced mobility: Secondary | ICD-10-CM | POA: Diagnosis not present

## 2023-04-13 DIAGNOSIS — M6281 Muscle weakness (generalized): Secondary | ICD-10-CM | POA: Diagnosis not present

## 2023-04-13 DIAGNOSIS — Z741 Need for assistance with personal care: Secondary | ICD-10-CM | POA: Diagnosis not present

## 2023-04-13 DIAGNOSIS — Z7409 Other reduced mobility: Secondary | ICD-10-CM | POA: Diagnosis not present

## 2023-04-13 DIAGNOSIS — R2681 Unsteadiness on feet: Secondary | ICD-10-CM | POA: Diagnosis not present

## 2023-04-14 DIAGNOSIS — R2681 Unsteadiness on feet: Secondary | ICD-10-CM | POA: Diagnosis not present

## 2023-04-14 DIAGNOSIS — Z7409 Other reduced mobility: Secondary | ICD-10-CM | POA: Diagnosis not present

## 2023-04-14 DIAGNOSIS — Z741 Need for assistance with personal care: Secondary | ICD-10-CM | POA: Diagnosis not present

## 2023-04-14 DIAGNOSIS — M6281 Muscle weakness (generalized): Secondary | ICD-10-CM | POA: Diagnosis not present

## 2023-04-15 DIAGNOSIS — Z741 Need for assistance with personal care: Secondary | ICD-10-CM | POA: Diagnosis not present

## 2023-04-15 DIAGNOSIS — R2681 Unsteadiness on feet: Secondary | ICD-10-CM | POA: Diagnosis not present

## 2023-04-15 DIAGNOSIS — Z7409 Other reduced mobility: Secondary | ICD-10-CM | POA: Diagnosis not present

## 2023-04-15 DIAGNOSIS — M6281 Muscle weakness (generalized): Secondary | ICD-10-CM | POA: Diagnosis not present

## 2023-04-16 DIAGNOSIS — R2681 Unsteadiness on feet: Secondary | ICD-10-CM | POA: Diagnosis not present

## 2023-04-16 DIAGNOSIS — Z741 Need for assistance with personal care: Secondary | ICD-10-CM | POA: Diagnosis not present

## 2023-04-16 DIAGNOSIS — M6281 Muscle weakness (generalized): Secondary | ICD-10-CM | POA: Diagnosis not present

## 2023-04-16 DIAGNOSIS — Z7409 Other reduced mobility: Secondary | ICD-10-CM | POA: Diagnosis not present

## 2023-04-17 DIAGNOSIS — M6281 Muscle weakness (generalized): Secondary | ICD-10-CM | POA: Diagnosis not present

## 2023-04-17 DIAGNOSIS — Z741 Need for assistance with personal care: Secondary | ICD-10-CM | POA: Diagnosis not present

## 2023-04-17 DIAGNOSIS — Z7409 Other reduced mobility: Secondary | ICD-10-CM | POA: Diagnosis not present

## 2023-04-17 DIAGNOSIS — R2681 Unsteadiness on feet: Secondary | ICD-10-CM | POA: Diagnosis not present

## 2023-04-20 DIAGNOSIS — M6281 Muscle weakness (generalized): Secondary | ICD-10-CM | POA: Diagnosis not present

## 2023-04-20 DIAGNOSIS — J811 Chronic pulmonary edema: Secondary | ICD-10-CM | POA: Diagnosis not present

## 2023-04-20 DIAGNOSIS — Z741 Need for assistance with personal care: Secondary | ICD-10-CM | POA: Diagnosis not present

## 2023-04-20 DIAGNOSIS — Z7409 Other reduced mobility: Secondary | ICD-10-CM | POA: Diagnosis not present

## 2023-04-20 DIAGNOSIS — I739 Peripheral vascular disease, unspecified: Secondary | ICD-10-CM | POA: Diagnosis not present

## 2023-04-20 DIAGNOSIS — E039 Hypothyroidism, unspecified: Secondary | ICD-10-CM | POA: Diagnosis not present

## 2023-04-20 DIAGNOSIS — R2681 Unsteadiness on feet: Secondary | ICD-10-CM | POA: Diagnosis not present

## 2023-04-21 DIAGNOSIS — Z7409 Other reduced mobility: Secondary | ICD-10-CM | POA: Diagnosis not present

## 2023-04-21 DIAGNOSIS — M6281 Muscle weakness (generalized): Secondary | ICD-10-CM | POA: Diagnosis not present

## 2023-04-21 DIAGNOSIS — E039 Hypothyroidism, unspecified: Secondary | ICD-10-CM | POA: Diagnosis not present

## 2023-04-21 DIAGNOSIS — R2681 Unsteadiness on feet: Secondary | ICD-10-CM | POA: Diagnosis not present

## 2023-04-21 DIAGNOSIS — Z741 Need for assistance with personal care: Secondary | ICD-10-CM | POA: Diagnosis not present

## 2023-04-22 DIAGNOSIS — Z741 Need for assistance with personal care: Secondary | ICD-10-CM | POA: Diagnosis not present

## 2023-04-22 DIAGNOSIS — Z7189 Other specified counseling: Secondary | ICD-10-CM | POA: Diagnosis not present

## 2023-04-22 DIAGNOSIS — M6281 Muscle weakness (generalized): Secondary | ICD-10-CM | POA: Diagnosis not present

## 2023-04-22 DIAGNOSIS — R2681 Unsteadiness on feet: Secondary | ICD-10-CM | POA: Diagnosis not present

## 2023-04-22 DIAGNOSIS — Z7409 Other reduced mobility: Secondary | ICD-10-CM | POA: Diagnosis not present

## 2023-04-23 DIAGNOSIS — R300 Dysuria: Secondary | ICD-10-CM | POA: Diagnosis not present

## 2023-04-23 DIAGNOSIS — Z741 Need for assistance with personal care: Secondary | ICD-10-CM | POA: Diagnosis not present

## 2023-04-23 DIAGNOSIS — R2681 Unsteadiness on feet: Secondary | ICD-10-CM | POA: Diagnosis not present

## 2023-04-23 DIAGNOSIS — M6281 Muscle weakness (generalized): Secondary | ICD-10-CM | POA: Diagnosis not present

## 2023-04-23 DIAGNOSIS — Z7409 Other reduced mobility: Secondary | ICD-10-CM | POA: Diagnosis not present

## 2023-04-24 ENCOUNTER — Other Ambulatory Visit: Payer: Self-pay | Admitting: *Deleted

## 2023-04-24 DIAGNOSIS — Z7409 Other reduced mobility: Secondary | ICD-10-CM | POA: Diagnosis not present

## 2023-04-24 DIAGNOSIS — Z96642 Presence of left artificial hip joint: Secondary | ICD-10-CM | POA: Diagnosis not present

## 2023-04-24 DIAGNOSIS — M6281 Muscle weakness (generalized): Secondary | ICD-10-CM | POA: Diagnosis not present

## 2023-04-24 DIAGNOSIS — M79606 Pain in leg, unspecified: Secondary | ICD-10-CM

## 2023-04-24 DIAGNOSIS — M978XXA Periprosthetic fracture around other internal prosthetic joint, initial encounter: Secondary | ICD-10-CM | POA: Diagnosis not present

## 2023-04-24 DIAGNOSIS — R2681 Unsteadiness on feet: Secondary | ICD-10-CM | POA: Diagnosis not present

## 2023-04-24 DIAGNOSIS — Z741 Need for assistance with personal care: Secondary | ICD-10-CM | POA: Diagnosis not present

## 2023-04-27 DIAGNOSIS — Z741 Need for assistance with personal care: Secondary | ICD-10-CM | POA: Diagnosis not present

## 2023-04-27 DIAGNOSIS — M6281 Muscle weakness (generalized): Secondary | ICD-10-CM | POA: Diagnosis not present

## 2023-04-27 DIAGNOSIS — Z7409 Other reduced mobility: Secondary | ICD-10-CM | POA: Diagnosis not present

## 2023-04-27 DIAGNOSIS — R2681 Unsteadiness on feet: Secondary | ICD-10-CM | POA: Diagnosis not present

## 2023-04-28 DIAGNOSIS — Z7409 Other reduced mobility: Secondary | ICD-10-CM | POA: Diagnosis not present

## 2023-04-28 DIAGNOSIS — M6281 Muscle weakness (generalized): Secondary | ICD-10-CM | POA: Diagnosis not present

## 2023-04-28 DIAGNOSIS — F03918 Unspecified dementia, unspecified severity, with other behavioral disturbance: Secondary | ICD-10-CM | POA: Diagnosis not present

## 2023-04-28 DIAGNOSIS — Z741 Need for assistance with personal care: Secondary | ICD-10-CM | POA: Diagnosis not present

## 2023-04-28 DIAGNOSIS — R2681 Unsteadiness on feet: Secondary | ICD-10-CM | POA: Diagnosis not present

## 2023-04-29 DIAGNOSIS — J449 Chronic obstructive pulmonary disease, unspecified: Secondary | ICD-10-CM | POA: Diagnosis not present

## 2023-04-29 DIAGNOSIS — Z741 Need for assistance with personal care: Secondary | ICD-10-CM | POA: Diagnosis not present

## 2023-04-29 DIAGNOSIS — Z7409 Other reduced mobility: Secondary | ICD-10-CM | POA: Diagnosis not present

## 2023-04-29 DIAGNOSIS — M6281 Muscle weakness (generalized): Secondary | ICD-10-CM | POA: Diagnosis not present

## 2023-04-29 DIAGNOSIS — R2681 Unsteadiness on feet: Secondary | ICD-10-CM | POA: Diagnosis not present

## 2023-04-30 DIAGNOSIS — Z741 Need for assistance with personal care: Secondary | ICD-10-CM | POA: Diagnosis not present

## 2023-04-30 DIAGNOSIS — J449 Chronic obstructive pulmonary disease, unspecified: Secondary | ICD-10-CM | POA: Diagnosis not present

## 2023-04-30 DIAGNOSIS — R2681 Unsteadiness on feet: Secondary | ICD-10-CM | POA: Diagnosis not present

## 2023-04-30 DIAGNOSIS — M6281 Muscle weakness (generalized): Secondary | ICD-10-CM | POA: Diagnosis not present

## 2023-04-30 DIAGNOSIS — Z7409 Other reduced mobility: Secondary | ICD-10-CM | POA: Diagnosis not present

## 2023-05-01 DIAGNOSIS — M6281 Muscle weakness (generalized): Secondary | ICD-10-CM | POA: Diagnosis not present

## 2023-05-01 DIAGNOSIS — Z741 Need for assistance with personal care: Secondary | ICD-10-CM | POA: Diagnosis not present

## 2023-05-01 DIAGNOSIS — Z7409 Other reduced mobility: Secondary | ICD-10-CM | POA: Diagnosis not present

## 2023-05-01 DIAGNOSIS — R2681 Unsteadiness on feet: Secondary | ICD-10-CM | POA: Diagnosis not present

## 2023-05-01 DIAGNOSIS — J449 Chronic obstructive pulmonary disease, unspecified: Secondary | ICD-10-CM | POA: Diagnosis not present

## 2023-05-04 NOTE — Progress Notes (Unsigned)
VASCULAR  Atherosclerosis of abdominal aorta is noted without aneurysm or dissection. Penetrating atherosclerotic ulcer is noted.  Severely stenoses are seen involving the origin of the celiac and right renal arteries.  Moderate stenosis of left external iliac artery is noted.  Severe stenoses of right common femoral artery is noted.  No definite evidence of gastrointestinal bleeding.  NON-VASCULAR  Large amount of air is noted within gallbladder of uncertain etiology. No cholelithiasis is noted. No biliary dilatation. Minimal left hepatic pneumobilia.  Stool is noted throughout the colon.  Pelvis is not well visualized due to scatter artifact arising from bilateral hip arthroplasties.

## 2023-05-05 ENCOUNTER — Ambulatory Visit (HOSPITAL_COMMUNITY)
Admission: RE | Admit: 2023-05-05 | Discharge: 2023-05-05 | Disposition: A | Discharge status: Home / Self Care | Payer: Medicare Other | Source: Ambulatory Visit | Attending: Vascular Surgery | Admitting: Vascular Surgery

## 2023-05-05 ENCOUNTER — Ambulatory Visit (INDEPENDENT_AMBULATORY_CARE_PROVIDER_SITE_OTHER): Payer: Medicare Other | Admitting: Vascular Surgery

## 2023-05-05 ENCOUNTER — Encounter: Payer: Self-pay | Admitting: Vascular Surgery

## 2023-05-05 VITALS — BP 141/77 | HR 68 | Temp 98.3°F | Resp 20 | Ht 60.0 in | Wt 110.0 lb

## 2023-05-05 DIAGNOSIS — I739 Peripheral vascular disease, unspecified: Secondary | ICD-10-CM | POA: Diagnosis not present

## 2023-05-05 DIAGNOSIS — M79606 Pain in leg, unspecified: Secondary | ICD-10-CM | POA: Insufficient documentation

## 2023-05-05 DIAGNOSIS — R300 Dysuria: Secondary | ICD-10-CM | POA: Diagnosis not present

## 2023-05-05 DIAGNOSIS — I719 Aortic aneurysm of unspecified site, without rupture: Secondary | ICD-10-CM | POA: Diagnosis not present

## 2023-05-05 LAB — VAS US ABI WITH/WO TBI
Left ABI: 1.07
Right ABI: 1.01

## 2023-05-12 ENCOUNTER — Other Ambulatory Visit: Payer: Self-pay

## 2023-05-12 DIAGNOSIS — I739 Peripheral vascular disease, unspecified: Secondary | ICD-10-CM

## 2023-05-12 DIAGNOSIS — I719 Aortic aneurysm of unspecified site, without rupture: Secondary | ICD-10-CM

## 2023-05-18 DIAGNOSIS — D509 Iron deficiency anemia, unspecified: Secondary | ICD-10-CM | POA: Diagnosis not present

## 2023-05-18 DIAGNOSIS — F17210 Nicotine dependence, cigarettes, uncomplicated: Secondary | ICD-10-CM | POA: Diagnosis not present

## 2023-05-18 DIAGNOSIS — M9702XD Periprosthetic fracture around internal prosthetic left hip joint, subsequent encounter: Secondary | ICD-10-CM | POA: Diagnosis not present

## 2023-05-18 DIAGNOSIS — Z8673 Personal history of transient ischemic attack (TIA), and cerebral infarction without residual deficits: Secondary | ICD-10-CM | POA: Diagnosis not present

## 2023-05-18 DIAGNOSIS — Z96643 Presence of artificial hip joint, bilateral: Secondary | ICD-10-CM | POA: Diagnosis not present

## 2023-05-18 DIAGNOSIS — R2681 Unsteadiness on feet: Secondary | ICD-10-CM | POA: Diagnosis not present

## 2023-05-18 DIAGNOSIS — I10 Essential (primary) hypertension: Secondary | ICD-10-CM | POA: Diagnosis not present

## 2023-05-18 DIAGNOSIS — I739 Peripheral vascular disease, unspecified: Secondary | ICD-10-CM | POA: Diagnosis not present

## 2023-05-18 DIAGNOSIS — J449 Chronic obstructive pulmonary disease, unspecified: Secondary | ICD-10-CM | POA: Diagnosis not present

## 2023-05-18 DIAGNOSIS — D519 Vitamin B12 deficiency anemia, unspecified: Secondary | ICD-10-CM | POA: Diagnosis not present

## 2023-05-18 DIAGNOSIS — G8929 Other chronic pain: Secondary | ICD-10-CM | POA: Diagnosis not present

## 2023-05-19 DIAGNOSIS — D519 Vitamin B12 deficiency anemia, unspecified: Secondary | ICD-10-CM | POA: Diagnosis not present

## 2023-05-19 DIAGNOSIS — Z8673 Personal history of transient ischemic attack (TIA), and cerebral infarction without residual deficits: Secondary | ICD-10-CM | POA: Diagnosis not present

## 2023-05-19 DIAGNOSIS — F17210 Nicotine dependence, cigarettes, uncomplicated: Secondary | ICD-10-CM | POA: Diagnosis not present

## 2023-05-19 DIAGNOSIS — D509 Iron deficiency anemia, unspecified: Secondary | ICD-10-CM | POA: Diagnosis not present

## 2023-05-19 DIAGNOSIS — G8929 Other chronic pain: Secondary | ICD-10-CM | POA: Diagnosis not present

## 2023-05-19 DIAGNOSIS — M9702XD Periprosthetic fracture around internal prosthetic left hip joint, subsequent encounter: Secondary | ICD-10-CM | POA: Diagnosis not present

## 2023-05-19 DIAGNOSIS — R2681 Unsteadiness on feet: Secondary | ICD-10-CM | POA: Diagnosis not present

## 2023-05-19 DIAGNOSIS — I739 Peripheral vascular disease, unspecified: Secondary | ICD-10-CM | POA: Diagnosis not present

## 2023-05-19 DIAGNOSIS — I10 Essential (primary) hypertension: Secondary | ICD-10-CM | POA: Diagnosis not present

## 2023-05-19 DIAGNOSIS — Z96643 Presence of artificial hip joint, bilateral: Secondary | ICD-10-CM | POA: Diagnosis not present

## 2023-05-19 DIAGNOSIS — J449 Chronic obstructive pulmonary disease, unspecified: Secondary | ICD-10-CM | POA: Diagnosis not present

## 2023-05-21 DIAGNOSIS — R2681 Unsteadiness on feet: Secondary | ICD-10-CM | POA: Diagnosis not present

## 2023-05-21 DIAGNOSIS — Z8673 Personal history of transient ischemic attack (TIA), and cerebral infarction without residual deficits: Secondary | ICD-10-CM | POA: Diagnosis not present

## 2023-05-21 DIAGNOSIS — D509 Iron deficiency anemia, unspecified: Secondary | ICD-10-CM | POA: Diagnosis not present

## 2023-05-21 DIAGNOSIS — F17210 Nicotine dependence, cigarettes, uncomplicated: Secondary | ICD-10-CM | POA: Diagnosis not present

## 2023-05-21 DIAGNOSIS — I10 Essential (primary) hypertension: Secondary | ICD-10-CM | POA: Diagnosis not present

## 2023-05-21 DIAGNOSIS — M9702XD Periprosthetic fracture around internal prosthetic left hip joint, subsequent encounter: Secondary | ICD-10-CM | POA: Diagnosis not present

## 2023-05-21 DIAGNOSIS — I739 Peripheral vascular disease, unspecified: Secondary | ICD-10-CM | POA: Diagnosis not present

## 2023-05-21 DIAGNOSIS — J449 Chronic obstructive pulmonary disease, unspecified: Secondary | ICD-10-CM | POA: Diagnosis not present

## 2023-05-21 DIAGNOSIS — D519 Vitamin B12 deficiency anemia, unspecified: Secondary | ICD-10-CM | POA: Diagnosis not present

## 2023-05-21 DIAGNOSIS — Z96643 Presence of artificial hip joint, bilateral: Secondary | ICD-10-CM | POA: Diagnosis not present

## 2023-05-21 DIAGNOSIS — G8929 Other chronic pain: Secondary | ICD-10-CM | POA: Diagnosis not present

## 2023-05-22 DIAGNOSIS — I10 Essential (primary) hypertension: Secondary | ICD-10-CM | POA: Diagnosis not present

## 2023-05-22 DIAGNOSIS — G8929 Other chronic pain: Secondary | ICD-10-CM | POA: Diagnosis not present

## 2023-05-22 DIAGNOSIS — Z96643 Presence of artificial hip joint, bilateral: Secondary | ICD-10-CM | POA: Diagnosis not present

## 2023-05-22 DIAGNOSIS — D509 Iron deficiency anemia, unspecified: Secondary | ICD-10-CM | POA: Diagnosis not present

## 2023-05-22 DIAGNOSIS — D519 Vitamin B12 deficiency anemia, unspecified: Secondary | ICD-10-CM | POA: Diagnosis not present

## 2023-05-22 DIAGNOSIS — I739 Peripheral vascular disease, unspecified: Secondary | ICD-10-CM | POA: Diagnosis not present

## 2023-05-22 DIAGNOSIS — F17210 Nicotine dependence, cigarettes, uncomplicated: Secondary | ICD-10-CM | POA: Diagnosis not present

## 2023-05-22 DIAGNOSIS — M9702XD Periprosthetic fracture around internal prosthetic left hip joint, subsequent encounter: Secondary | ICD-10-CM | POA: Diagnosis not present

## 2023-05-22 DIAGNOSIS — R2681 Unsteadiness on feet: Secondary | ICD-10-CM | POA: Diagnosis not present

## 2023-05-22 DIAGNOSIS — Z8673 Personal history of transient ischemic attack (TIA), and cerebral infarction without residual deficits: Secondary | ICD-10-CM | POA: Diagnosis not present

## 2023-05-22 DIAGNOSIS — J449 Chronic obstructive pulmonary disease, unspecified: Secondary | ICD-10-CM | POA: Diagnosis not present

## 2023-05-25 DIAGNOSIS — I739 Peripheral vascular disease, unspecified: Secondary | ICD-10-CM | POA: Diagnosis not present

## 2023-05-25 DIAGNOSIS — R2681 Unsteadiness on feet: Secondary | ICD-10-CM | POA: Diagnosis not present

## 2023-05-25 DIAGNOSIS — F17210 Nicotine dependence, cigarettes, uncomplicated: Secondary | ICD-10-CM | POA: Diagnosis not present

## 2023-05-25 DIAGNOSIS — D509 Iron deficiency anemia, unspecified: Secondary | ICD-10-CM | POA: Diagnosis not present

## 2023-05-25 DIAGNOSIS — D519 Vitamin B12 deficiency anemia, unspecified: Secondary | ICD-10-CM | POA: Diagnosis not present

## 2023-05-25 DIAGNOSIS — M9702XD Periprosthetic fracture around internal prosthetic left hip joint, subsequent encounter: Secondary | ICD-10-CM | POA: Diagnosis not present

## 2023-05-25 DIAGNOSIS — Z96643 Presence of artificial hip joint, bilateral: Secondary | ICD-10-CM | POA: Diagnosis not present

## 2023-05-25 DIAGNOSIS — Z8673 Personal history of transient ischemic attack (TIA), and cerebral infarction without residual deficits: Secondary | ICD-10-CM | POA: Diagnosis not present

## 2023-05-25 DIAGNOSIS — J449 Chronic obstructive pulmonary disease, unspecified: Secondary | ICD-10-CM | POA: Diagnosis not present

## 2023-05-25 DIAGNOSIS — I10 Essential (primary) hypertension: Secondary | ICD-10-CM | POA: Diagnosis not present

## 2023-05-25 DIAGNOSIS — G8929 Other chronic pain: Secondary | ICD-10-CM | POA: Diagnosis not present

## 2023-05-26 DIAGNOSIS — I10 Essential (primary) hypertension: Secondary | ICD-10-CM | POA: Diagnosis not present

## 2023-05-26 DIAGNOSIS — J449 Chronic obstructive pulmonary disease, unspecified: Secondary | ICD-10-CM | POA: Diagnosis not present

## 2023-05-26 DIAGNOSIS — G8929 Other chronic pain: Secondary | ICD-10-CM | POA: Diagnosis not present

## 2023-05-26 DIAGNOSIS — R2681 Unsteadiness on feet: Secondary | ICD-10-CM | POA: Diagnosis not present

## 2023-05-26 DIAGNOSIS — I739 Peripheral vascular disease, unspecified: Secondary | ICD-10-CM | POA: Diagnosis not present

## 2023-05-26 DIAGNOSIS — M9702XD Periprosthetic fracture around internal prosthetic left hip joint, subsequent encounter: Secondary | ICD-10-CM | POA: Diagnosis not present

## 2023-05-26 DIAGNOSIS — Z96643 Presence of artificial hip joint, bilateral: Secondary | ICD-10-CM | POA: Diagnosis not present

## 2023-05-26 DIAGNOSIS — D519 Vitamin B12 deficiency anemia, unspecified: Secondary | ICD-10-CM | POA: Diagnosis not present

## 2023-05-26 DIAGNOSIS — F17210 Nicotine dependence, cigarettes, uncomplicated: Secondary | ICD-10-CM | POA: Diagnosis not present

## 2023-05-26 DIAGNOSIS — D509 Iron deficiency anemia, unspecified: Secondary | ICD-10-CM | POA: Diagnosis not present

## 2023-05-26 DIAGNOSIS — Z8673 Personal history of transient ischemic attack (TIA), and cerebral infarction without residual deficits: Secondary | ICD-10-CM | POA: Diagnosis not present

## 2023-05-27 DIAGNOSIS — I10 Essential (primary) hypertension: Secondary | ICD-10-CM | POA: Diagnosis not present

## 2023-05-27 DIAGNOSIS — Z0000 Encounter for general adult medical examination without abnormal findings: Secondary | ICD-10-CM | POA: Diagnosis not present

## 2023-05-27 DIAGNOSIS — E039 Hypothyroidism, unspecified: Secondary | ICD-10-CM | POA: Diagnosis not present

## 2023-05-27 DIAGNOSIS — K219 Gastro-esophageal reflux disease without esophagitis: Secondary | ICD-10-CM | POA: Diagnosis not present

## 2023-05-28 DIAGNOSIS — M9702XD Periprosthetic fracture around internal prosthetic left hip joint, subsequent encounter: Secondary | ICD-10-CM | POA: Diagnosis not present

## 2023-05-28 DIAGNOSIS — F17210 Nicotine dependence, cigarettes, uncomplicated: Secondary | ICD-10-CM | POA: Diagnosis not present

## 2023-05-28 DIAGNOSIS — I739 Peripheral vascular disease, unspecified: Secondary | ICD-10-CM | POA: Diagnosis not present

## 2023-05-28 DIAGNOSIS — D519 Vitamin B12 deficiency anemia, unspecified: Secondary | ICD-10-CM | POA: Diagnosis not present

## 2023-05-28 DIAGNOSIS — Z96643 Presence of artificial hip joint, bilateral: Secondary | ICD-10-CM | POA: Diagnosis not present

## 2023-05-28 DIAGNOSIS — J449 Chronic obstructive pulmonary disease, unspecified: Secondary | ICD-10-CM | POA: Diagnosis not present

## 2023-05-28 DIAGNOSIS — Z8673 Personal history of transient ischemic attack (TIA), and cerebral infarction without residual deficits: Secondary | ICD-10-CM | POA: Diagnosis not present

## 2023-05-28 DIAGNOSIS — D509 Iron deficiency anemia, unspecified: Secondary | ICD-10-CM | POA: Diagnosis not present

## 2023-05-28 DIAGNOSIS — R2681 Unsteadiness on feet: Secondary | ICD-10-CM | POA: Diagnosis not present

## 2023-05-28 DIAGNOSIS — I10 Essential (primary) hypertension: Secondary | ICD-10-CM | POA: Diagnosis not present

## 2023-05-28 DIAGNOSIS — G8929 Other chronic pain: Secondary | ICD-10-CM | POA: Diagnosis not present

## 2023-05-31 DIAGNOSIS — J449 Chronic obstructive pulmonary disease, unspecified: Secondary | ICD-10-CM | POA: Diagnosis not present

## 2023-06-02 DIAGNOSIS — F17210 Nicotine dependence, cigarettes, uncomplicated: Secondary | ICD-10-CM | POA: Diagnosis not present

## 2023-06-02 DIAGNOSIS — D509 Iron deficiency anemia, unspecified: Secondary | ICD-10-CM | POA: Diagnosis not present

## 2023-06-02 DIAGNOSIS — I10 Essential (primary) hypertension: Secondary | ICD-10-CM | POA: Diagnosis not present

## 2023-06-02 DIAGNOSIS — J449 Chronic obstructive pulmonary disease, unspecified: Secondary | ICD-10-CM | POA: Diagnosis not present

## 2023-06-02 DIAGNOSIS — G8929 Other chronic pain: Secondary | ICD-10-CM | POA: Diagnosis not present

## 2023-06-02 DIAGNOSIS — R2681 Unsteadiness on feet: Secondary | ICD-10-CM | POA: Diagnosis not present

## 2023-06-02 DIAGNOSIS — M9702XD Periprosthetic fracture around internal prosthetic left hip joint, subsequent encounter: Secondary | ICD-10-CM | POA: Diagnosis not present

## 2023-06-02 DIAGNOSIS — I739 Peripheral vascular disease, unspecified: Secondary | ICD-10-CM | POA: Diagnosis not present

## 2023-06-02 DIAGNOSIS — D519 Vitamin B12 deficiency anemia, unspecified: Secondary | ICD-10-CM | POA: Diagnosis not present

## 2023-06-02 DIAGNOSIS — Z8673 Personal history of transient ischemic attack (TIA), and cerebral infarction without residual deficits: Secondary | ICD-10-CM | POA: Diagnosis not present

## 2023-06-02 DIAGNOSIS — Z96643 Presence of artificial hip joint, bilateral: Secondary | ICD-10-CM | POA: Diagnosis not present

## 2023-06-09 DIAGNOSIS — Z96643 Presence of artificial hip joint, bilateral: Secondary | ICD-10-CM | POA: Diagnosis not present

## 2023-06-09 DIAGNOSIS — R2681 Unsteadiness on feet: Secondary | ICD-10-CM | POA: Diagnosis not present

## 2023-06-09 DIAGNOSIS — G8929 Other chronic pain: Secondary | ICD-10-CM | POA: Diagnosis not present

## 2023-06-09 DIAGNOSIS — J449 Chronic obstructive pulmonary disease, unspecified: Secondary | ICD-10-CM | POA: Diagnosis not present

## 2023-06-09 DIAGNOSIS — I10 Essential (primary) hypertension: Secondary | ICD-10-CM | POA: Diagnosis not present

## 2023-06-09 DIAGNOSIS — D509 Iron deficiency anemia, unspecified: Secondary | ICD-10-CM | POA: Diagnosis not present

## 2023-06-09 DIAGNOSIS — Z8673 Personal history of transient ischemic attack (TIA), and cerebral infarction without residual deficits: Secondary | ICD-10-CM | POA: Diagnosis not present

## 2023-06-09 DIAGNOSIS — F17210 Nicotine dependence, cigarettes, uncomplicated: Secondary | ICD-10-CM | POA: Diagnosis not present

## 2023-06-09 DIAGNOSIS — D519 Vitamin B12 deficiency anemia, unspecified: Secondary | ICD-10-CM | POA: Diagnosis not present

## 2023-06-09 DIAGNOSIS — M9702XD Periprosthetic fracture around internal prosthetic left hip joint, subsequent encounter: Secondary | ICD-10-CM | POA: Diagnosis not present

## 2023-06-09 DIAGNOSIS — I739 Peripheral vascular disease, unspecified: Secondary | ICD-10-CM | POA: Diagnosis not present

## 2023-06-10 DIAGNOSIS — G8929 Other chronic pain: Secondary | ICD-10-CM | POA: Diagnosis not present

## 2023-06-10 DIAGNOSIS — M9702XD Periprosthetic fracture around internal prosthetic left hip joint, subsequent encounter: Secondary | ICD-10-CM | POA: Diagnosis not present

## 2023-06-15 DIAGNOSIS — F17210 Nicotine dependence, cigarettes, uncomplicated: Secondary | ICD-10-CM | POA: Diagnosis not present

## 2023-06-15 DIAGNOSIS — M9702XD Periprosthetic fracture around internal prosthetic left hip joint, subsequent encounter: Secondary | ICD-10-CM | POA: Diagnosis not present

## 2023-06-15 DIAGNOSIS — G8929 Other chronic pain: Secondary | ICD-10-CM | POA: Diagnosis not present

## 2023-06-15 DIAGNOSIS — Z8673 Personal history of transient ischemic attack (TIA), and cerebral infarction without residual deficits: Secondary | ICD-10-CM | POA: Diagnosis not present

## 2023-06-15 DIAGNOSIS — Z96643 Presence of artificial hip joint, bilateral: Secondary | ICD-10-CM | POA: Diagnosis not present

## 2023-06-15 DIAGNOSIS — D509 Iron deficiency anemia, unspecified: Secondary | ICD-10-CM | POA: Diagnosis not present

## 2023-06-15 DIAGNOSIS — D519 Vitamin B12 deficiency anemia, unspecified: Secondary | ICD-10-CM | POA: Diagnosis not present

## 2023-06-15 DIAGNOSIS — R2681 Unsteadiness on feet: Secondary | ICD-10-CM | POA: Diagnosis not present

## 2023-06-15 DIAGNOSIS — I10 Essential (primary) hypertension: Secondary | ICD-10-CM | POA: Diagnosis not present

## 2023-06-15 DIAGNOSIS — I739 Peripheral vascular disease, unspecified: Secondary | ICD-10-CM | POA: Diagnosis not present

## 2023-06-15 DIAGNOSIS — J449 Chronic obstructive pulmonary disease, unspecified: Secondary | ICD-10-CM | POA: Diagnosis not present

## 2023-06-23 DIAGNOSIS — M9702XD Periprosthetic fracture around internal prosthetic left hip joint, subsequent encounter: Secondary | ICD-10-CM | POA: Diagnosis not present

## 2023-06-30 DIAGNOSIS — Z8673 Personal history of transient ischemic attack (TIA), and cerebral infarction without residual deficits: Secondary | ICD-10-CM | POA: Diagnosis not present

## 2023-06-30 DIAGNOSIS — D519 Vitamin B12 deficiency anemia, unspecified: Secondary | ICD-10-CM | POA: Diagnosis not present

## 2023-06-30 DIAGNOSIS — Z96643 Presence of artificial hip joint, bilateral: Secondary | ICD-10-CM | POA: Diagnosis not present

## 2023-06-30 DIAGNOSIS — F17210 Nicotine dependence, cigarettes, uncomplicated: Secondary | ICD-10-CM | POA: Diagnosis not present

## 2023-06-30 DIAGNOSIS — G8929 Other chronic pain: Secondary | ICD-10-CM | POA: Diagnosis not present

## 2023-06-30 DIAGNOSIS — I10 Essential (primary) hypertension: Secondary | ICD-10-CM | POA: Diagnosis not present

## 2023-06-30 DIAGNOSIS — I739 Peripheral vascular disease, unspecified: Secondary | ICD-10-CM | POA: Diagnosis not present

## 2023-06-30 DIAGNOSIS — J449 Chronic obstructive pulmonary disease, unspecified: Secondary | ICD-10-CM | POA: Diagnosis not present

## 2023-06-30 DIAGNOSIS — R2681 Unsteadiness on feet: Secondary | ICD-10-CM | POA: Diagnosis not present

## 2023-06-30 DIAGNOSIS — D509 Iron deficiency anemia, unspecified: Secondary | ICD-10-CM | POA: Diagnosis not present

## 2023-06-30 DIAGNOSIS — M9702XD Periprosthetic fracture around internal prosthetic left hip joint, subsequent encounter: Secondary | ICD-10-CM | POA: Diagnosis not present

## 2023-07-01 DIAGNOSIS — J449 Chronic obstructive pulmonary disease, unspecified: Secondary | ICD-10-CM | POA: Diagnosis not present

## 2023-07-06 DIAGNOSIS — G8929 Other chronic pain: Secondary | ICD-10-CM | POA: Diagnosis not present

## 2023-07-06 DIAGNOSIS — I10 Essential (primary) hypertension: Secondary | ICD-10-CM | POA: Diagnosis not present

## 2023-07-06 DIAGNOSIS — D509 Iron deficiency anemia, unspecified: Secondary | ICD-10-CM | POA: Diagnosis not present

## 2023-07-06 DIAGNOSIS — F17210 Nicotine dependence, cigarettes, uncomplicated: Secondary | ICD-10-CM | POA: Diagnosis not present

## 2023-07-06 DIAGNOSIS — I739 Peripheral vascular disease, unspecified: Secondary | ICD-10-CM | POA: Diagnosis not present

## 2023-07-06 DIAGNOSIS — Z96643 Presence of artificial hip joint, bilateral: Secondary | ICD-10-CM | POA: Diagnosis not present

## 2023-07-06 DIAGNOSIS — Z8673 Personal history of transient ischemic attack (TIA), and cerebral infarction without residual deficits: Secondary | ICD-10-CM | POA: Diagnosis not present

## 2023-07-06 DIAGNOSIS — J449 Chronic obstructive pulmonary disease, unspecified: Secondary | ICD-10-CM | POA: Diagnosis not present

## 2023-07-06 DIAGNOSIS — D519 Vitamin B12 deficiency anemia, unspecified: Secondary | ICD-10-CM | POA: Diagnosis not present

## 2023-07-06 DIAGNOSIS — M9702XD Periprosthetic fracture around internal prosthetic left hip joint, subsequent encounter: Secondary | ICD-10-CM | POA: Diagnosis not present

## 2023-07-06 DIAGNOSIS — R2681 Unsteadiness on feet: Secondary | ICD-10-CM | POA: Diagnosis not present

## 2023-07-15 DIAGNOSIS — I739 Peripheral vascular disease, unspecified: Secondary | ICD-10-CM | POA: Diagnosis not present

## 2023-07-15 DIAGNOSIS — M9702XD Periprosthetic fracture around internal prosthetic left hip joint, subsequent encounter: Secondary | ICD-10-CM | POA: Diagnosis not present

## 2023-07-15 DIAGNOSIS — R2681 Unsteadiness on feet: Secondary | ICD-10-CM | POA: Diagnosis not present

## 2023-07-15 DIAGNOSIS — G8929 Other chronic pain: Secondary | ICD-10-CM | POA: Diagnosis not present

## 2023-07-15 DIAGNOSIS — Z96643 Presence of artificial hip joint, bilateral: Secondary | ICD-10-CM | POA: Diagnosis not present

## 2023-07-15 DIAGNOSIS — I10 Essential (primary) hypertension: Secondary | ICD-10-CM | POA: Diagnosis not present

## 2023-07-15 DIAGNOSIS — D509 Iron deficiency anemia, unspecified: Secondary | ICD-10-CM | POA: Diagnosis not present

## 2023-07-15 DIAGNOSIS — Z8673 Personal history of transient ischemic attack (TIA), and cerebral infarction without residual deficits: Secondary | ICD-10-CM | POA: Diagnosis not present

## 2023-07-15 DIAGNOSIS — F17210 Nicotine dependence, cigarettes, uncomplicated: Secondary | ICD-10-CM | POA: Diagnosis not present

## 2023-07-15 DIAGNOSIS — J449 Chronic obstructive pulmonary disease, unspecified: Secondary | ICD-10-CM | POA: Diagnosis not present

## 2023-07-15 DIAGNOSIS — D519 Vitamin B12 deficiency anemia, unspecified: Secondary | ICD-10-CM | POA: Diagnosis not present

## 2023-07-23 DIAGNOSIS — M9702XD Periprosthetic fracture around internal prosthetic left hip joint, subsequent encounter: Secondary | ICD-10-CM | POA: Diagnosis not present

## 2023-07-23 DIAGNOSIS — D509 Iron deficiency anemia, unspecified: Secondary | ICD-10-CM | POA: Diagnosis not present

## 2023-07-23 DIAGNOSIS — D519 Vitamin B12 deficiency anemia, unspecified: Secondary | ICD-10-CM | POA: Diagnosis not present

## 2023-07-23 DIAGNOSIS — Z96643 Presence of artificial hip joint, bilateral: Secondary | ICD-10-CM | POA: Diagnosis not present

## 2023-07-23 DIAGNOSIS — Z8673 Personal history of transient ischemic attack (TIA), and cerebral infarction without residual deficits: Secondary | ICD-10-CM | POA: Diagnosis not present

## 2023-07-23 DIAGNOSIS — I10 Essential (primary) hypertension: Secondary | ICD-10-CM | POA: Diagnosis not present

## 2023-07-23 DIAGNOSIS — F17210 Nicotine dependence, cigarettes, uncomplicated: Secondary | ICD-10-CM | POA: Diagnosis not present

## 2023-07-23 DIAGNOSIS — R2681 Unsteadiness on feet: Secondary | ICD-10-CM | POA: Diagnosis not present

## 2023-07-23 DIAGNOSIS — J449 Chronic obstructive pulmonary disease, unspecified: Secondary | ICD-10-CM | POA: Diagnosis not present

## 2023-07-23 DIAGNOSIS — G8929 Other chronic pain: Secondary | ICD-10-CM | POA: Diagnosis not present

## 2023-07-23 DIAGNOSIS — I739 Peripheral vascular disease, unspecified: Secondary | ICD-10-CM | POA: Diagnosis not present

## 2023-07-28 DIAGNOSIS — Z96643 Presence of artificial hip joint, bilateral: Secondary | ICD-10-CM | POA: Diagnosis not present

## 2023-07-28 DIAGNOSIS — Z8673 Personal history of transient ischemic attack (TIA), and cerebral infarction without residual deficits: Secondary | ICD-10-CM | POA: Diagnosis not present

## 2023-07-28 DIAGNOSIS — F17210 Nicotine dependence, cigarettes, uncomplicated: Secondary | ICD-10-CM | POA: Diagnosis not present

## 2023-07-28 DIAGNOSIS — I10 Essential (primary) hypertension: Secondary | ICD-10-CM | POA: Diagnosis not present

## 2023-07-28 DIAGNOSIS — D519 Vitamin B12 deficiency anemia, unspecified: Secondary | ICD-10-CM | POA: Diagnosis not present

## 2023-07-28 DIAGNOSIS — I739 Peripheral vascular disease, unspecified: Secondary | ICD-10-CM | POA: Diagnosis not present

## 2023-07-28 DIAGNOSIS — M9702XD Periprosthetic fracture around internal prosthetic left hip joint, subsequent encounter: Secondary | ICD-10-CM | POA: Diagnosis not present

## 2023-07-28 DIAGNOSIS — R2681 Unsteadiness on feet: Secondary | ICD-10-CM | POA: Diagnosis not present

## 2023-07-28 DIAGNOSIS — G8929 Other chronic pain: Secondary | ICD-10-CM | POA: Diagnosis not present

## 2023-07-28 DIAGNOSIS — J449 Chronic obstructive pulmonary disease, unspecified: Secondary | ICD-10-CM | POA: Diagnosis not present

## 2023-07-28 DIAGNOSIS — D509 Iron deficiency anemia, unspecified: Secondary | ICD-10-CM | POA: Diagnosis not present

## 2023-08-01 DIAGNOSIS — J449 Chronic obstructive pulmonary disease, unspecified: Secondary | ICD-10-CM | POA: Diagnosis not present

## 2023-08-04 DIAGNOSIS — F17210 Nicotine dependence, cigarettes, uncomplicated: Secondary | ICD-10-CM | POA: Diagnosis not present

## 2023-08-04 DIAGNOSIS — G8929 Other chronic pain: Secondary | ICD-10-CM | POA: Diagnosis not present

## 2023-08-04 DIAGNOSIS — Z8673 Personal history of transient ischemic attack (TIA), and cerebral infarction without residual deficits: Secondary | ICD-10-CM | POA: Diagnosis not present

## 2023-08-04 DIAGNOSIS — I739 Peripheral vascular disease, unspecified: Secondary | ICD-10-CM | POA: Diagnosis not present

## 2023-08-04 DIAGNOSIS — J449 Chronic obstructive pulmonary disease, unspecified: Secondary | ICD-10-CM | POA: Diagnosis not present

## 2023-08-04 DIAGNOSIS — D519 Vitamin B12 deficiency anemia, unspecified: Secondary | ICD-10-CM | POA: Diagnosis not present

## 2023-08-04 DIAGNOSIS — I10 Essential (primary) hypertension: Secondary | ICD-10-CM | POA: Diagnosis not present

## 2023-08-04 DIAGNOSIS — Z96643 Presence of artificial hip joint, bilateral: Secondary | ICD-10-CM | POA: Diagnosis not present

## 2023-08-04 DIAGNOSIS — M9702XD Periprosthetic fracture around internal prosthetic left hip joint, subsequent encounter: Secondary | ICD-10-CM | POA: Diagnosis not present

## 2023-08-04 DIAGNOSIS — D509 Iron deficiency anemia, unspecified: Secondary | ICD-10-CM | POA: Diagnosis not present

## 2023-08-04 DIAGNOSIS — R2681 Unsteadiness on feet: Secondary | ICD-10-CM | POA: Diagnosis not present

## 2023-08-12 DIAGNOSIS — Z8673 Personal history of transient ischemic attack (TIA), and cerebral infarction without residual deficits: Secondary | ICD-10-CM | POA: Diagnosis not present

## 2023-08-12 DIAGNOSIS — D509 Iron deficiency anemia, unspecified: Secondary | ICD-10-CM | POA: Diagnosis not present

## 2023-08-12 DIAGNOSIS — I739 Peripheral vascular disease, unspecified: Secondary | ICD-10-CM | POA: Diagnosis not present

## 2023-08-12 DIAGNOSIS — I10 Essential (primary) hypertension: Secondary | ICD-10-CM | POA: Diagnosis not present

## 2023-08-12 DIAGNOSIS — R2681 Unsteadiness on feet: Secondary | ICD-10-CM | POA: Diagnosis not present

## 2023-08-12 DIAGNOSIS — M9702XD Periprosthetic fracture around internal prosthetic left hip joint, subsequent encounter: Secondary | ICD-10-CM | POA: Diagnosis not present

## 2023-08-12 DIAGNOSIS — Z96643 Presence of artificial hip joint, bilateral: Secondary | ICD-10-CM | POA: Diagnosis not present

## 2023-08-12 DIAGNOSIS — J449 Chronic obstructive pulmonary disease, unspecified: Secondary | ICD-10-CM | POA: Diagnosis not present

## 2023-08-12 DIAGNOSIS — F17210 Nicotine dependence, cigarettes, uncomplicated: Secondary | ICD-10-CM | POA: Diagnosis not present

## 2023-08-12 DIAGNOSIS — G8929 Other chronic pain: Secondary | ICD-10-CM | POA: Diagnosis not present

## 2023-08-12 DIAGNOSIS — D519 Vitamin B12 deficiency anemia, unspecified: Secondary | ICD-10-CM | POA: Diagnosis not present

## 2023-08-20 DIAGNOSIS — D519 Vitamin B12 deficiency anemia, unspecified: Secondary | ICD-10-CM | POA: Diagnosis not present

## 2023-08-20 DIAGNOSIS — F17210 Nicotine dependence, cigarettes, uncomplicated: Secondary | ICD-10-CM | POA: Diagnosis not present

## 2023-08-20 DIAGNOSIS — R2681 Unsteadiness on feet: Secondary | ICD-10-CM | POA: Diagnosis not present

## 2023-08-20 DIAGNOSIS — Z8673 Personal history of transient ischemic attack (TIA), and cerebral infarction without residual deficits: Secondary | ICD-10-CM | POA: Diagnosis not present

## 2023-08-20 DIAGNOSIS — M9702XD Periprosthetic fracture around internal prosthetic left hip joint, subsequent encounter: Secondary | ICD-10-CM | POA: Diagnosis not present

## 2023-08-20 DIAGNOSIS — I10 Essential (primary) hypertension: Secondary | ICD-10-CM | POA: Diagnosis not present

## 2023-08-20 DIAGNOSIS — Z96643 Presence of artificial hip joint, bilateral: Secondary | ICD-10-CM | POA: Diagnosis not present

## 2023-08-20 DIAGNOSIS — D509 Iron deficiency anemia, unspecified: Secondary | ICD-10-CM | POA: Diagnosis not present

## 2023-08-20 DIAGNOSIS — G8929 Other chronic pain: Secondary | ICD-10-CM | POA: Diagnosis not present

## 2023-08-20 DIAGNOSIS — J449 Chronic obstructive pulmonary disease, unspecified: Secondary | ICD-10-CM | POA: Diagnosis not present

## 2023-08-20 DIAGNOSIS — I739 Peripheral vascular disease, unspecified: Secondary | ICD-10-CM | POA: Diagnosis not present

## 2023-08-25 DIAGNOSIS — R2681 Unsteadiness on feet: Secondary | ICD-10-CM | POA: Diagnosis not present

## 2023-08-25 DIAGNOSIS — D519 Vitamin B12 deficiency anemia, unspecified: Secondary | ICD-10-CM | POA: Diagnosis not present

## 2023-08-25 DIAGNOSIS — I739 Peripheral vascular disease, unspecified: Secondary | ICD-10-CM | POA: Diagnosis not present

## 2023-08-25 DIAGNOSIS — F17210 Nicotine dependence, cigarettes, uncomplicated: Secondary | ICD-10-CM | POA: Diagnosis not present

## 2023-08-25 DIAGNOSIS — G8929 Other chronic pain: Secondary | ICD-10-CM | POA: Diagnosis not present

## 2023-08-25 DIAGNOSIS — Z8673 Personal history of transient ischemic attack (TIA), and cerebral infarction without residual deficits: Secondary | ICD-10-CM | POA: Diagnosis not present

## 2023-08-25 DIAGNOSIS — Z96643 Presence of artificial hip joint, bilateral: Secondary | ICD-10-CM | POA: Diagnosis not present

## 2023-08-25 DIAGNOSIS — D509 Iron deficiency anemia, unspecified: Secondary | ICD-10-CM | POA: Diagnosis not present

## 2023-08-25 DIAGNOSIS — I10 Essential (primary) hypertension: Secondary | ICD-10-CM | POA: Diagnosis not present

## 2023-08-25 DIAGNOSIS — J449 Chronic obstructive pulmonary disease, unspecified: Secondary | ICD-10-CM | POA: Diagnosis not present

## 2023-08-25 DIAGNOSIS — M9702XD Periprosthetic fracture around internal prosthetic left hip joint, subsequent encounter: Secondary | ICD-10-CM | POA: Diagnosis not present

## 2023-08-31 DIAGNOSIS — J449 Chronic obstructive pulmonary disease, unspecified: Secondary | ICD-10-CM | POA: Diagnosis not present

## 2023-09-01 DIAGNOSIS — Z8673 Personal history of transient ischemic attack (TIA), and cerebral infarction without residual deficits: Secondary | ICD-10-CM | POA: Diagnosis not present

## 2023-09-01 DIAGNOSIS — Z96643 Presence of artificial hip joint, bilateral: Secondary | ICD-10-CM | POA: Diagnosis not present

## 2023-09-01 DIAGNOSIS — R2681 Unsteadiness on feet: Secondary | ICD-10-CM | POA: Diagnosis not present

## 2023-09-01 DIAGNOSIS — I739 Peripheral vascular disease, unspecified: Secondary | ICD-10-CM | POA: Diagnosis not present

## 2023-09-01 DIAGNOSIS — F17210 Nicotine dependence, cigarettes, uncomplicated: Secondary | ICD-10-CM | POA: Diagnosis not present

## 2023-09-01 DIAGNOSIS — G8929 Other chronic pain: Secondary | ICD-10-CM | POA: Diagnosis not present

## 2023-09-01 DIAGNOSIS — J449 Chronic obstructive pulmonary disease, unspecified: Secondary | ICD-10-CM | POA: Diagnosis not present

## 2023-09-01 DIAGNOSIS — D509 Iron deficiency anemia, unspecified: Secondary | ICD-10-CM | POA: Diagnosis not present

## 2023-09-01 DIAGNOSIS — D519 Vitamin B12 deficiency anemia, unspecified: Secondary | ICD-10-CM | POA: Diagnosis not present

## 2023-09-01 DIAGNOSIS — M9702XD Periprosthetic fracture around internal prosthetic left hip joint, subsequent encounter: Secondary | ICD-10-CM | POA: Diagnosis not present

## 2023-09-01 DIAGNOSIS — I10 Essential (primary) hypertension: Secondary | ICD-10-CM | POA: Diagnosis not present

## 2023-09-11 DIAGNOSIS — I10 Essential (primary) hypertension: Secondary | ICD-10-CM | POA: Diagnosis not present

## 2023-09-11 DIAGNOSIS — Z96643 Presence of artificial hip joint, bilateral: Secondary | ICD-10-CM | POA: Diagnosis not present

## 2023-09-11 DIAGNOSIS — D519 Vitamin B12 deficiency anemia, unspecified: Secondary | ICD-10-CM | POA: Diagnosis not present

## 2023-09-11 DIAGNOSIS — R2681 Unsteadiness on feet: Secondary | ICD-10-CM | POA: Diagnosis not present

## 2023-09-11 DIAGNOSIS — G8929 Other chronic pain: Secondary | ICD-10-CM | POA: Diagnosis not present

## 2023-09-11 DIAGNOSIS — Z8673 Personal history of transient ischemic attack (TIA), and cerebral infarction without residual deficits: Secondary | ICD-10-CM | POA: Diagnosis not present

## 2023-09-11 DIAGNOSIS — F17210 Nicotine dependence, cigarettes, uncomplicated: Secondary | ICD-10-CM | POA: Diagnosis not present

## 2023-09-11 DIAGNOSIS — J449 Chronic obstructive pulmonary disease, unspecified: Secondary | ICD-10-CM | POA: Diagnosis not present

## 2023-09-11 DIAGNOSIS — D509 Iron deficiency anemia, unspecified: Secondary | ICD-10-CM | POA: Diagnosis not present

## 2023-09-11 DIAGNOSIS — I739 Peripheral vascular disease, unspecified: Secondary | ICD-10-CM | POA: Diagnosis not present

## 2023-09-11 DIAGNOSIS — M9702XD Periprosthetic fracture around internal prosthetic left hip joint, subsequent encounter: Secondary | ICD-10-CM | POA: Diagnosis not present

## 2023-09-11 DIAGNOSIS — S72002D Fracture of unspecified part of neck of left femur, subsequent encounter for closed fracture with routine healing: Secondary | ICD-10-CM | POA: Diagnosis not present

## 2023-09-15 DIAGNOSIS — I10 Essential (primary) hypertension: Secondary | ICD-10-CM | POA: Diagnosis not present

## 2023-09-15 DIAGNOSIS — I739 Peripheral vascular disease, unspecified: Secondary | ICD-10-CM | POA: Diagnosis not present

## 2023-09-15 DIAGNOSIS — F17210 Nicotine dependence, cigarettes, uncomplicated: Secondary | ICD-10-CM | POA: Diagnosis not present

## 2023-09-15 DIAGNOSIS — G8929 Other chronic pain: Secondary | ICD-10-CM | POA: Diagnosis not present

## 2023-09-15 DIAGNOSIS — D509 Iron deficiency anemia, unspecified: Secondary | ICD-10-CM | POA: Diagnosis not present

## 2023-09-15 DIAGNOSIS — Z8673 Personal history of transient ischemic attack (TIA), and cerebral infarction without residual deficits: Secondary | ICD-10-CM | POA: Diagnosis not present

## 2023-09-15 DIAGNOSIS — Z96643 Presence of artificial hip joint, bilateral: Secondary | ICD-10-CM | POA: Diagnosis not present

## 2023-09-15 DIAGNOSIS — S72002D Fracture of unspecified part of neck of left femur, subsequent encounter for closed fracture with routine healing: Secondary | ICD-10-CM | POA: Diagnosis not present

## 2023-09-15 DIAGNOSIS — J449 Chronic obstructive pulmonary disease, unspecified: Secondary | ICD-10-CM | POA: Diagnosis not present

## 2023-09-15 DIAGNOSIS — D519 Vitamin B12 deficiency anemia, unspecified: Secondary | ICD-10-CM | POA: Diagnosis not present

## 2023-09-15 DIAGNOSIS — M9702XD Periprosthetic fracture around internal prosthetic left hip joint, subsequent encounter: Secondary | ICD-10-CM | POA: Diagnosis not present

## 2023-09-15 DIAGNOSIS — R2681 Unsteadiness on feet: Secondary | ICD-10-CM | POA: Diagnosis not present

## 2023-09-22 DIAGNOSIS — D519 Vitamin B12 deficiency anemia, unspecified: Secondary | ICD-10-CM | POA: Diagnosis not present

## 2023-09-22 DIAGNOSIS — D509 Iron deficiency anemia, unspecified: Secondary | ICD-10-CM | POA: Diagnosis not present

## 2023-09-22 DIAGNOSIS — M9702XD Periprosthetic fracture around internal prosthetic left hip joint, subsequent encounter: Secondary | ICD-10-CM | POA: Diagnosis not present

## 2023-09-22 DIAGNOSIS — J449 Chronic obstructive pulmonary disease, unspecified: Secondary | ICD-10-CM | POA: Diagnosis not present

## 2023-09-22 DIAGNOSIS — G8929 Other chronic pain: Secondary | ICD-10-CM | POA: Diagnosis not present

## 2023-09-22 DIAGNOSIS — S72002D Fracture of unspecified part of neck of left femur, subsequent encounter for closed fracture with routine healing: Secondary | ICD-10-CM | POA: Diagnosis not present

## 2023-09-22 DIAGNOSIS — Z96643 Presence of artificial hip joint, bilateral: Secondary | ICD-10-CM | POA: Diagnosis not present

## 2023-09-22 DIAGNOSIS — I739 Peripheral vascular disease, unspecified: Secondary | ICD-10-CM | POA: Diagnosis not present

## 2023-09-22 DIAGNOSIS — R2681 Unsteadiness on feet: Secondary | ICD-10-CM | POA: Diagnosis not present

## 2023-09-22 DIAGNOSIS — F17210 Nicotine dependence, cigarettes, uncomplicated: Secondary | ICD-10-CM | POA: Diagnosis not present

## 2023-09-22 DIAGNOSIS — Z8673 Personal history of transient ischemic attack (TIA), and cerebral infarction without residual deficits: Secondary | ICD-10-CM | POA: Diagnosis not present

## 2023-09-22 DIAGNOSIS — I10 Essential (primary) hypertension: Secondary | ICD-10-CM | POA: Diagnosis not present

## 2023-09-29 DIAGNOSIS — F17210 Nicotine dependence, cigarettes, uncomplicated: Secondary | ICD-10-CM | POA: Diagnosis not present

## 2023-09-29 DIAGNOSIS — D509 Iron deficiency anemia, unspecified: Secondary | ICD-10-CM | POA: Diagnosis not present

## 2023-09-29 DIAGNOSIS — J449 Chronic obstructive pulmonary disease, unspecified: Secondary | ICD-10-CM | POA: Diagnosis not present

## 2023-09-29 DIAGNOSIS — Z96643 Presence of artificial hip joint, bilateral: Secondary | ICD-10-CM | POA: Diagnosis not present

## 2023-09-29 DIAGNOSIS — G8929 Other chronic pain: Secondary | ICD-10-CM | POA: Diagnosis not present

## 2023-09-29 DIAGNOSIS — Z8673 Personal history of transient ischemic attack (TIA), and cerebral infarction without residual deficits: Secondary | ICD-10-CM | POA: Diagnosis not present

## 2023-09-29 DIAGNOSIS — M9702XD Periprosthetic fracture around internal prosthetic left hip joint, subsequent encounter: Secondary | ICD-10-CM | POA: Diagnosis not present

## 2023-09-29 DIAGNOSIS — I10 Essential (primary) hypertension: Secondary | ICD-10-CM | POA: Diagnosis not present

## 2023-09-29 DIAGNOSIS — I739 Peripheral vascular disease, unspecified: Secondary | ICD-10-CM | POA: Diagnosis not present

## 2023-09-29 DIAGNOSIS — S72002D Fracture of unspecified part of neck of left femur, subsequent encounter for closed fracture with routine healing: Secondary | ICD-10-CM | POA: Diagnosis not present

## 2023-09-29 DIAGNOSIS — D519 Vitamin B12 deficiency anemia, unspecified: Secondary | ICD-10-CM | POA: Diagnosis not present

## 2023-09-29 DIAGNOSIS — R2681 Unsteadiness on feet: Secondary | ICD-10-CM | POA: Diagnosis not present

## 2023-10-01 DIAGNOSIS — J449 Chronic obstructive pulmonary disease, unspecified: Secondary | ICD-10-CM | POA: Diagnosis not present

## 2023-10-06 DIAGNOSIS — F17210 Nicotine dependence, cigarettes, uncomplicated: Secondary | ICD-10-CM | POA: Diagnosis not present

## 2023-10-06 DIAGNOSIS — S72002D Fracture of unspecified part of neck of left femur, subsequent encounter for closed fracture with routine healing: Secondary | ICD-10-CM | POA: Diagnosis not present

## 2023-10-06 DIAGNOSIS — D509 Iron deficiency anemia, unspecified: Secondary | ICD-10-CM | POA: Diagnosis not present

## 2023-10-06 DIAGNOSIS — G8929 Other chronic pain: Secondary | ICD-10-CM | POA: Diagnosis not present

## 2023-10-06 DIAGNOSIS — I10 Essential (primary) hypertension: Secondary | ICD-10-CM | POA: Diagnosis not present

## 2023-10-06 DIAGNOSIS — D519 Vitamin B12 deficiency anemia, unspecified: Secondary | ICD-10-CM | POA: Diagnosis not present

## 2023-10-06 DIAGNOSIS — M9702XD Periprosthetic fracture around internal prosthetic left hip joint, subsequent encounter: Secondary | ICD-10-CM | POA: Diagnosis not present

## 2023-10-06 DIAGNOSIS — J449 Chronic obstructive pulmonary disease, unspecified: Secondary | ICD-10-CM | POA: Diagnosis not present

## 2023-10-06 DIAGNOSIS — I739 Peripheral vascular disease, unspecified: Secondary | ICD-10-CM | POA: Diagnosis not present

## 2023-10-06 DIAGNOSIS — Z8673 Personal history of transient ischemic attack (TIA), and cerebral infarction without residual deficits: Secondary | ICD-10-CM | POA: Diagnosis not present

## 2023-10-06 DIAGNOSIS — Z96643 Presence of artificial hip joint, bilateral: Secondary | ICD-10-CM | POA: Diagnosis not present

## 2023-10-06 DIAGNOSIS — R2681 Unsteadiness on feet: Secondary | ICD-10-CM | POA: Diagnosis not present

## 2023-10-15 DIAGNOSIS — F17210 Nicotine dependence, cigarettes, uncomplicated: Secondary | ICD-10-CM | POA: Diagnosis not present

## 2023-10-15 DIAGNOSIS — J449 Chronic obstructive pulmonary disease, unspecified: Secondary | ICD-10-CM | POA: Diagnosis not present

## 2023-10-15 DIAGNOSIS — I739 Peripheral vascular disease, unspecified: Secondary | ICD-10-CM | POA: Diagnosis not present

## 2023-10-15 DIAGNOSIS — Z96643 Presence of artificial hip joint, bilateral: Secondary | ICD-10-CM | POA: Diagnosis not present

## 2023-10-15 DIAGNOSIS — I10 Essential (primary) hypertension: Secondary | ICD-10-CM | POA: Diagnosis not present

## 2023-10-15 DIAGNOSIS — G8929 Other chronic pain: Secondary | ICD-10-CM | POA: Diagnosis not present

## 2023-10-15 DIAGNOSIS — Z8673 Personal history of transient ischemic attack (TIA), and cerebral infarction without residual deficits: Secondary | ICD-10-CM | POA: Diagnosis not present

## 2023-10-15 DIAGNOSIS — R2681 Unsteadiness on feet: Secondary | ICD-10-CM | POA: Diagnosis not present

## 2023-10-15 DIAGNOSIS — D519 Vitamin B12 deficiency anemia, unspecified: Secondary | ICD-10-CM | POA: Diagnosis not present

## 2023-10-15 DIAGNOSIS — M9702XD Periprosthetic fracture around internal prosthetic left hip joint, subsequent encounter: Secondary | ICD-10-CM | POA: Diagnosis not present

## 2023-10-15 DIAGNOSIS — S72002D Fracture of unspecified part of neck of left femur, subsequent encounter for closed fracture with routine healing: Secondary | ICD-10-CM | POA: Diagnosis not present

## 2023-10-15 DIAGNOSIS — D509 Iron deficiency anemia, unspecified: Secondary | ICD-10-CM | POA: Diagnosis not present

## 2023-10-31 DIAGNOSIS — J449 Chronic obstructive pulmonary disease, unspecified: Secondary | ICD-10-CM | POA: Diagnosis not present

## 2023-11-02 DIAGNOSIS — Z79899 Other long term (current) drug therapy: Secondary | ICD-10-CM | POA: Diagnosis not present

## 2023-11-02 DIAGNOSIS — E782 Mixed hyperlipidemia: Secondary | ICD-10-CM | POA: Diagnosis not present

## 2023-11-02 DIAGNOSIS — Z23 Encounter for immunization: Secondary | ICD-10-CM | POA: Diagnosis not present

## 2023-11-02 DIAGNOSIS — Z6820 Body mass index (BMI) 20.0-20.9, adult: Secondary | ICD-10-CM | POA: Diagnosis not present

## 2023-11-08 DIAGNOSIS — J449 Chronic obstructive pulmonary disease, unspecified: Secondary | ICD-10-CM | POA: Diagnosis not present

## 2023-11-08 DIAGNOSIS — J9602 Acute respiratory failure with hypercapnia: Secondary | ICD-10-CM | POA: Diagnosis not present

## 2023-11-08 DIAGNOSIS — R531 Weakness: Secondary | ICD-10-CM | POA: Diagnosis not present

## 2023-11-08 DIAGNOSIS — Z8711 Personal history of peptic ulcer disease: Secondary | ICD-10-CM | POA: Diagnosis not present

## 2023-11-08 DIAGNOSIS — M6259 Muscle wasting and atrophy, not elsewhere classified, multiple sites: Secondary | ICD-10-CM | POA: Diagnosis not present

## 2023-11-08 DIAGNOSIS — Z741 Need for assistance with personal care: Secondary | ICD-10-CM | POA: Diagnosis not present

## 2023-11-08 DIAGNOSIS — F17210 Nicotine dependence, cigarettes, uncomplicated: Secondary | ICD-10-CM | POA: Diagnosis not present

## 2023-11-08 DIAGNOSIS — R9431 Abnormal electrocardiogram [ECG] [EKG]: Secondary | ICD-10-CM | POA: Diagnosis not present

## 2023-11-08 DIAGNOSIS — I4510 Unspecified right bundle-branch block: Secondary | ICD-10-CM | POA: Diagnosis not present

## 2023-11-08 DIAGNOSIS — J206 Acute bronchitis due to rhinovirus: Secondary | ICD-10-CM | POA: Diagnosis not present

## 2023-11-08 DIAGNOSIS — M6281 Muscle weakness (generalized): Secondary | ICD-10-CM | POA: Diagnosis not present

## 2023-11-08 DIAGNOSIS — K219 Gastro-esophageal reflux disease without esophagitis: Secondary | ICD-10-CM | POA: Diagnosis not present

## 2023-11-08 DIAGNOSIS — F17209 Nicotine dependence, unspecified, with unspecified nicotine-induced disorders: Secondary | ICD-10-CM | POA: Diagnosis not present

## 2023-11-08 DIAGNOSIS — Z7409 Other reduced mobility: Secondary | ICD-10-CM | POA: Diagnosis not present

## 2023-11-08 DIAGNOSIS — J99 Respiratory disorders in diseases classified elsewhere: Secondary | ICD-10-CM | POA: Diagnosis not present

## 2023-11-08 DIAGNOSIS — J441 Chronic obstructive pulmonary disease with (acute) exacerbation: Secondary | ICD-10-CM | POA: Diagnosis not present

## 2023-11-08 DIAGNOSIS — R000 Tachycardia, unspecified: Secondary | ICD-10-CM | POA: Diagnosis not present

## 2023-11-08 DIAGNOSIS — G9341 Metabolic encephalopathy: Secondary | ICD-10-CM | POA: Diagnosis not present

## 2023-11-08 DIAGNOSIS — Z8673 Personal history of transient ischemic attack (TIA), and cerebral infarction without residual deficits: Secondary | ICD-10-CM | POA: Diagnosis not present

## 2023-11-08 DIAGNOSIS — D649 Anemia, unspecified: Secondary | ICD-10-CM | POA: Diagnosis not present

## 2023-11-08 DIAGNOSIS — R5381 Other malaise: Secondary | ICD-10-CM | POA: Diagnosis not present

## 2023-11-08 DIAGNOSIS — D519 Vitamin B12 deficiency anemia, unspecified: Secondary | ICD-10-CM | POA: Diagnosis not present

## 2023-11-08 DIAGNOSIS — R0602 Shortness of breath: Secondary | ICD-10-CM | POA: Diagnosis not present

## 2023-11-08 DIAGNOSIS — J9620 Acute and chronic respiratory failure, unspecified whether with hypoxia or hypercapnia: Secondary | ICD-10-CM | POA: Diagnosis not present

## 2023-11-08 DIAGNOSIS — Z743 Need for continuous supervision: Secondary | ICD-10-CM | POA: Diagnosis not present

## 2023-11-08 DIAGNOSIS — I739 Peripheral vascular disease, unspecified: Secondary | ICD-10-CM | POA: Diagnosis not present

## 2023-11-08 DIAGNOSIS — Z9981 Dependence on supplemental oxygen: Secondary | ICD-10-CM | POA: Diagnosis not present

## 2023-11-08 DIAGNOSIS — R410 Disorientation, unspecified: Secondary | ICD-10-CM | POA: Diagnosis not present

## 2023-11-08 DIAGNOSIS — M1990 Unspecified osteoarthritis, unspecified site: Secondary | ICD-10-CM | POA: Diagnosis not present

## 2023-11-08 DIAGNOSIS — Z885 Allergy status to narcotic agent status: Secondary | ICD-10-CM | POA: Diagnosis not present

## 2023-11-08 DIAGNOSIS — D509 Iron deficiency anemia, unspecified: Secondary | ICD-10-CM | POA: Diagnosis not present

## 2023-11-08 DIAGNOSIS — R911 Solitary pulmonary nodule: Secondary | ICD-10-CM | POA: Diagnosis not present

## 2023-11-08 DIAGNOSIS — R2681 Unsteadiness on feet: Secondary | ICD-10-CM | POA: Diagnosis not present

## 2023-11-08 DIAGNOSIS — R0689 Other abnormalities of breathing: Secondary | ICD-10-CM | POA: Diagnosis not present

## 2023-11-08 DIAGNOSIS — G9340 Encephalopathy, unspecified: Secondary | ICD-10-CM | POA: Diagnosis not present

## 2023-11-08 DIAGNOSIS — Z7401 Bed confinement status: Secondary | ICD-10-CM | POA: Diagnosis not present

## 2023-11-08 DIAGNOSIS — I160 Hypertensive urgency: Secondary | ICD-10-CM | POA: Diagnosis not present

## 2023-11-08 DIAGNOSIS — R7989 Other specified abnormal findings of blood chemistry: Secondary | ICD-10-CM | POA: Diagnosis not present

## 2023-11-08 DIAGNOSIS — J440 Chronic obstructive pulmonary disease with acute lower respiratory infection: Secondary | ICD-10-CM | POA: Diagnosis not present

## 2023-11-08 DIAGNOSIS — E875 Hyperkalemia: Secondary | ICD-10-CM | POA: Diagnosis not present

## 2023-11-08 DIAGNOSIS — I10 Essential (primary) hypertension: Secondary | ICD-10-CM | POA: Diagnosis not present

## 2023-11-08 DIAGNOSIS — J9621 Acute and chronic respiratory failure with hypoxia: Secondary | ICD-10-CM | POA: Diagnosis not present

## 2023-11-08 DIAGNOSIS — R488 Other symbolic dysfunctions: Secondary | ICD-10-CM | POA: Diagnosis not present

## 2023-11-08 DIAGNOSIS — R0902 Hypoxemia: Secondary | ICD-10-CM | POA: Diagnosis not present

## 2023-11-10 DIAGNOSIS — R0602 Shortness of breath: Secondary | ICD-10-CM | POA: Diagnosis not present

## 2023-11-16 DIAGNOSIS — D649 Anemia, unspecified: Secondary | ICD-10-CM | POA: Diagnosis not present

## 2023-11-16 DIAGNOSIS — R531 Weakness: Secondary | ICD-10-CM | POA: Diagnosis not present

## 2023-11-16 DIAGNOSIS — Z743 Need for continuous supervision: Secondary | ICD-10-CM | POA: Diagnosis not present

## 2023-11-16 DIAGNOSIS — D509 Iron deficiency anemia, unspecified: Secondary | ICD-10-CM | POA: Diagnosis not present

## 2023-11-16 DIAGNOSIS — Z741 Need for assistance with personal care: Secondary | ICD-10-CM | POA: Diagnosis not present

## 2023-11-16 DIAGNOSIS — G9341 Metabolic encephalopathy: Secondary | ICD-10-CM | POA: Diagnosis not present

## 2023-11-16 DIAGNOSIS — Z8673 Personal history of transient ischemic attack (TIA), and cerebral infarction without residual deficits: Secondary | ICD-10-CM | POA: Diagnosis not present

## 2023-11-16 DIAGNOSIS — R488 Other symbolic dysfunctions: Secondary | ICD-10-CM | POA: Diagnosis not present

## 2023-11-16 DIAGNOSIS — G9340 Encephalopathy, unspecified: Secondary | ICD-10-CM | POA: Diagnosis not present

## 2023-11-16 DIAGNOSIS — M6259 Muscle wasting and atrophy, not elsewhere classified, multiple sites: Secondary | ICD-10-CM | POA: Diagnosis not present

## 2023-11-16 DIAGNOSIS — J9602 Acute respiratory failure with hypercapnia: Secondary | ICD-10-CM | POA: Diagnosis not present

## 2023-11-16 DIAGNOSIS — Z720 Tobacco use: Secondary | ICD-10-CM | POA: Diagnosis not present

## 2023-11-16 DIAGNOSIS — J441 Chronic obstructive pulmonary disease with (acute) exacerbation: Secondary | ICD-10-CM | POA: Diagnosis not present

## 2023-11-16 DIAGNOSIS — Z7409 Other reduced mobility: Secondary | ICD-10-CM | POA: Diagnosis not present

## 2023-11-16 DIAGNOSIS — I739 Peripheral vascular disease, unspecified: Secondary | ICD-10-CM | POA: Diagnosis not present

## 2023-11-16 DIAGNOSIS — J99 Respiratory disorders in diseases classified elsewhere: Secondary | ICD-10-CM | POA: Diagnosis not present

## 2023-11-16 DIAGNOSIS — K219 Gastro-esophageal reflux disease without esophagitis: Secondary | ICD-10-CM | POA: Diagnosis not present

## 2023-11-16 DIAGNOSIS — J9620 Acute and chronic respiratory failure, unspecified whether with hypoxia or hypercapnia: Secondary | ICD-10-CM | POA: Diagnosis not present

## 2023-11-16 DIAGNOSIS — Z7401 Bed confinement status: Secondary | ICD-10-CM | POA: Diagnosis not present

## 2023-11-16 DIAGNOSIS — R2681 Unsteadiness on feet: Secondary | ICD-10-CM | POA: Diagnosis not present

## 2023-11-16 DIAGNOSIS — I10 Essential (primary) hypertension: Secondary | ICD-10-CM | POA: Diagnosis not present

## 2023-11-16 DIAGNOSIS — M6281 Muscle weakness (generalized): Secondary | ICD-10-CM | POA: Diagnosis not present

## 2023-11-16 DIAGNOSIS — D519 Vitamin B12 deficiency anemia, unspecified: Secondary | ICD-10-CM | POA: Diagnosis not present

## 2023-11-16 DIAGNOSIS — J449 Chronic obstructive pulmonary disease, unspecified: Secondary | ICD-10-CM | POA: Diagnosis not present

## 2023-11-17 ENCOUNTER — Encounter (HOSPITAL_COMMUNITY): Payer: Medicare Other

## 2023-11-17 ENCOUNTER — Ambulatory Visit: Payer: Medicare Other | Admitting: Vascular Surgery

## 2023-11-17 ENCOUNTER — Other Ambulatory Visit (HOSPITAL_COMMUNITY): Payer: Medicare Other

## 2023-11-23 DIAGNOSIS — J449 Chronic obstructive pulmonary disease, unspecified: Secondary | ICD-10-CM | POA: Diagnosis not present

## 2023-11-23 DIAGNOSIS — J9620 Acute and chronic respiratory failure, unspecified whether with hypoxia or hypercapnia: Secondary | ICD-10-CM | POA: Diagnosis not present

## 2023-11-23 DIAGNOSIS — I739 Peripheral vascular disease, unspecified: Secondary | ICD-10-CM | POA: Diagnosis not present

## 2023-11-23 DIAGNOSIS — Z720 Tobacco use: Secondary | ICD-10-CM | POA: Diagnosis not present

## 2023-11-30 DIAGNOSIS — R2681 Unsteadiness on feet: Secondary | ICD-10-CM | POA: Diagnosis not present

## 2023-11-30 DIAGNOSIS — Z741 Need for assistance with personal care: Secondary | ICD-10-CM | POA: Diagnosis not present

## 2023-11-30 DIAGNOSIS — M6281 Muscle weakness (generalized): Secondary | ICD-10-CM | POA: Diagnosis not present

## 2023-11-30 DIAGNOSIS — R488 Other symbolic dysfunctions: Secondary | ICD-10-CM | POA: Diagnosis not present

## 2023-11-30 DIAGNOSIS — Z7409 Other reduced mobility: Secondary | ICD-10-CM | POA: Diagnosis not present

## 2023-12-01 DIAGNOSIS — M6281 Muscle weakness (generalized): Secondary | ICD-10-CM | POA: Diagnosis not present

## 2023-12-01 DIAGNOSIS — R2681 Unsteadiness on feet: Secondary | ICD-10-CM | POA: Diagnosis not present

## 2023-12-01 DIAGNOSIS — Z7409 Other reduced mobility: Secondary | ICD-10-CM | POA: Diagnosis not present

## 2023-12-01 DIAGNOSIS — Z741 Need for assistance with personal care: Secondary | ICD-10-CM | POA: Diagnosis not present

## 2023-12-01 DIAGNOSIS — J449 Chronic obstructive pulmonary disease, unspecified: Secondary | ICD-10-CM | POA: Diagnosis not present

## 2023-12-01 DIAGNOSIS — R488 Other symbolic dysfunctions: Secondary | ICD-10-CM | POA: Diagnosis not present

## 2023-12-02 DIAGNOSIS — R2681 Unsteadiness on feet: Secondary | ICD-10-CM | POA: Diagnosis not present

## 2023-12-02 DIAGNOSIS — Z741 Need for assistance with personal care: Secondary | ICD-10-CM | POA: Diagnosis not present

## 2023-12-02 DIAGNOSIS — M6281 Muscle weakness (generalized): Secondary | ICD-10-CM | POA: Diagnosis not present

## 2023-12-02 DIAGNOSIS — R488 Other symbolic dysfunctions: Secondary | ICD-10-CM | POA: Diagnosis not present

## 2023-12-02 DIAGNOSIS — Z7409 Other reduced mobility: Secondary | ICD-10-CM | POA: Diagnosis not present

## 2023-12-04 DIAGNOSIS — N3000 Acute cystitis without hematuria: Secondary | ICD-10-CM | POA: Diagnosis not present

## 2023-12-05 DIAGNOSIS — R051 Acute cough: Secondary | ICD-10-CM | POA: Diagnosis not present

## 2023-12-07 DIAGNOSIS — M6281 Muscle weakness (generalized): Secondary | ICD-10-CM | POA: Diagnosis not present

## 2023-12-07 DIAGNOSIS — Z741 Need for assistance with personal care: Secondary | ICD-10-CM | POA: Diagnosis not present

## 2023-12-07 DIAGNOSIS — Z7409 Other reduced mobility: Secondary | ICD-10-CM | POA: Diagnosis not present

## 2023-12-07 DIAGNOSIS — R488 Other symbolic dysfunctions: Secondary | ICD-10-CM | POA: Diagnosis not present

## 2023-12-07 DIAGNOSIS — R2681 Unsteadiness on feet: Secondary | ICD-10-CM | POA: Diagnosis not present

## 2023-12-08 DIAGNOSIS — M6281 Muscle weakness (generalized): Secondary | ICD-10-CM | POA: Diagnosis not present

## 2023-12-08 DIAGNOSIS — R488 Other symbolic dysfunctions: Secondary | ICD-10-CM | POA: Diagnosis not present

## 2023-12-08 DIAGNOSIS — Z7409 Other reduced mobility: Secondary | ICD-10-CM | POA: Diagnosis not present

## 2023-12-08 DIAGNOSIS — Z741 Need for assistance with personal care: Secondary | ICD-10-CM | POA: Diagnosis not present

## 2023-12-08 DIAGNOSIS — R2681 Unsteadiness on feet: Secondary | ICD-10-CM | POA: Diagnosis not present

## 2023-12-11 DIAGNOSIS — Z741 Need for assistance with personal care: Secondary | ICD-10-CM | POA: Diagnosis not present

## 2023-12-11 DIAGNOSIS — R488 Other symbolic dysfunctions: Secondary | ICD-10-CM | POA: Diagnosis not present

## 2023-12-11 DIAGNOSIS — R2681 Unsteadiness on feet: Secondary | ICD-10-CM | POA: Diagnosis not present

## 2023-12-11 DIAGNOSIS — M6281 Muscle weakness (generalized): Secondary | ICD-10-CM | POA: Diagnosis not present

## 2023-12-11 DIAGNOSIS — Z7409 Other reduced mobility: Secondary | ICD-10-CM | POA: Diagnosis not present

## 2023-12-14 NOTE — Progress Notes (Deleted)
 [facility] [place]  ASSESSMENT / PLAN: 78 y.o. female with atherosclerosis of native arteries of bilateral lower extremities causing no symptoms.  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  Recommend:  Abstinence from all tobacco products. Blood glucose control with goal A1c < 7%. Blood pressure control with goal blood pressure < 140/90 mmHg. Lipid reduction therapy with goal LDL-C <100 mg/dL  Aspirin 81mg  PO QD.  Atorvastatin 40-80mg  PO QD (or other high intensity statin therapy).  I will see her again in 6 months with duplex of the abdominal aorta to evaluate the atherosclerotic ulcers dimensions.  As long as this is staying stable we will avoid intervention.  I counseled her to present to the ER should she develop sudden severe abdominal pain.  CHIEF COMPLAINT: Imaging follow-up  HISTORY OF PRESENT ILLNESS: Ariel Baker is a 78 y.o. female recently admitted to [facility] for treatment after a fall and hip fracture.  She was discharged to rehab, where she made fairly good progress.  She is recently returned home.  A CT angiogram was done at [facility] for evaluation of a GI bleed.  This showed 2 areas of vascular concern: A penetrating atherosclerotic ulcer of the infrarenal aorta and diffuse atherosclerotic change through the imaged vessels.  On my evaluation, the patient is in wheelchair.  She reports she is ambulatory but only for short distances.  She does not walk fast or far enough to claudicate.  She does not describe rest pain in her feet.  She has no ulcers about her feet.  We reviewed the imaging findings in detail with her and her daughter.  Past Medical History:  Diagnosis Date   Anemia    COPD (chronic obstructive pulmonary disease) (HCC)    Depression    Hypertension    Hyponatremia    Peripheral vascular disease (HCC)    Pulmonary edema    Thyroid  disease    TIA  (transient ischemic attack)    Vitamin B 12 deficiency     Past Surgical History:  Procedure Laterality Date   hip replacement Bilateral    lung lobectomy Left    SHOULDER SURGERY      No family history on file.  Social History   Socioeconomic History   Marital status: Married    Spouse name: Not on file   Number of children: Not on file   Years of education: Not on file   Highest education level: Not on file  Occupational History   Not on file  Tobacco Use   Smoking status: Every Day    Current packs/day: 0.25    Types: Cigarettes   Smokeless tobacco: Never  Substance and Sexual Activity   Alcohol use: Not Currently   Drug use: Not on file   Sexual activity: Not on file  Other Topics Concern   Not on file  Social History Narrative   Not on file   Social Drivers of Health   Financial Resource Strain: Not on file  Food Insecurity: Not on file  Transportation Needs: Not on file  Physical Activity: Not on file  Stress: Not on file  Social Connections: Not on file  Intimate Partner Violence: Not on file    Allergies  Allergen Reactions   Hydrocodone Other (See Comments)    Current Outpatient Medications  Medication Sig Dispense Refill   BREZTRI AEROSPHERE 160-9-4.8 MCG/ACT AERO Inhale into the lungs.     divalproex (DEPAKOTE) 125  MG DR tablet Take by mouth.     esomeprazole (NEXIUM) 20 MG capsule Take 20 mg by mouth daily.     levothyroxine (SYNTHROID) 25 MCG tablet Take 25 mcg by mouth daily.     lisinopril (ZESTRIL) 10 MG tablet Take 10 mg by mouth daily.     LORazepam (ATIVAN) 1 MG tablet Take 1 mg by mouth daily as needed.     meloxicam (MOBIC) 7.5 MG tablet Take by mouth.     pravastatin (PRAVACHOL) 40 MG tablet Take by mouth.     venlafaxine XR (EFFEXOR-XR) 75 MG 24 hr capsule Take 75 mg by mouth daily.     VENTOLIN HFA 108 (90 Base) MCG/ACT inhaler Inhale 2 puffs into the lungs every 6 (six) hours as needed.     No current facility-administered  medications for this visit.    PHYSICAL EXAM There were no vitals filed for this visit.  Elderly frail woman in no acute distress.  In a wheelchair. Regular rate and rhythm Unlabored breathing No pedal pulses palpable; warm feet.   PERTINENT LABORATORY AND RADIOLOGIC DATA  Most recent CBC    08/03/2008    3:13 PM  CBC  WBC 8.6   Hemoglobin 12.9   Hematocrit 38.6   Platelets 291      Most recent CMP    08/03/2008    3:13 PM  CMP  Glucose 92   BUN 4   Creatinine 0.69   Sodium 137   Potassium 4.2   Chloride 102   CO2 29   Calcium 9.2     +-------+-----------+-----------+------------+------------+  ABI/TBIToday's ABIToday's TBIPrevious ABIPrevious TBI  +-------+-----------+-----------+------------+------------+  Right 1.01       0.65                                 +-------+-----------+-----------+------------+------------+  Left  1.07       0.64                                 +-------+-----------+-----------+------------+------------+       CT Angiogram  VASCULAR  Atherosclerosis of abdominal aorta is noted without aneurysm or dissection. Penetrating atherosclerotic ulcer is noted.  Severely stenoses are seen involving the origin of the celiac and right renal arteries.  Moderate stenosis of left external iliac artery is noted.  Severe stenoses of right common femoral artery is noted.  No definite evidence of gastrointestinal bleeding.  NON-VASCULAR  Large amount of air is noted within gallbladder of uncertain etiology. No cholelithiasis is noted. No biliary dilatation. Minimal left hepatic pneumobilia.  Stool is noted throughout the colon.  Pelvis is not well visualized due to scatter artifact arising from bilateral hip arthroplasties.   [name]. Magda, MD FACS [facility] Phone Number: [phone] 12/14/2023 7:41 AM   Total time spent on preparing this encounter including chart  review, data review, collecting history, examining the patient, coordinating care for this new patient, 60 minutes.  Portions of this report may have been transcribed using voice recognition software.  Every effort has been made to ensure accuracy; however, inadvertent computerized transcription errors may still be present.

## 2023-12-15 ENCOUNTER — Ambulatory Visit (HOSPITAL_COMMUNITY): Payer: Medicare Other | Attending: Vascular Surgery

## 2023-12-15 ENCOUNTER — Ambulatory Visit (HOSPITAL_COMMUNITY): Admission: RE | Admit: 2023-12-15 | Payer: Medicare Other | Source: Ambulatory Visit

## 2023-12-15 ENCOUNTER — Ambulatory Visit: Payer: Medicare Other | Admitting: Vascular Surgery
